# Patient Record
Sex: Female | Born: 1955 | Race: White | Hispanic: No | Smoking: Never smoker
Health system: Southern US, Community
[De-identification: ages and names within clinical notes are randomized; demographics above are authoritative.]

## PROBLEM LIST (undated history)

## (undated) DIAGNOSIS — N151 Renal and perinephric abscess: Secondary | ICD-10-CM

## (undated) DIAGNOSIS — C44711 Basal cell carcinoma of skin of unspecified lower limb, including hip: Secondary | ICD-10-CM

## (undated) DIAGNOSIS — N200 Calculus of kidney: Secondary | ICD-10-CM

## (undated) DIAGNOSIS — I10 Essential (primary) hypertension: Secondary | ICD-10-CM

## (undated) DIAGNOSIS — F329 Major depressive disorder, single episode, unspecified: Secondary | ICD-10-CM

## (undated) DIAGNOSIS — F32A Depression, unspecified: Secondary | ICD-10-CM

## (undated) DIAGNOSIS — E785 Hyperlipidemia, unspecified: Secondary | ICD-10-CM

## (undated) DIAGNOSIS — R011 Cardiac murmur, unspecified: Secondary | ICD-10-CM

## (undated) DIAGNOSIS — F419 Anxiety disorder, unspecified: Secondary | ICD-10-CM

## (undated) DIAGNOSIS — D649 Anemia, unspecified: Secondary | ICD-10-CM

## (undated) DIAGNOSIS — N641 Fat necrosis of breast: Secondary | ICD-10-CM

## (undated) HISTORY — DX: Calculus of kidney: N20.0

## (undated) HISTORY — DX: Hyperlipidemia, unspecified: E78.5

## (undated) HISTORY — PX: BREAST SURGERY: SHX581

## (undated) HISTORY — DX: Essential (primary) hypertension: I10

## (undated) HISTORY — DX: Anxiety disorder, unspecified: F41.9

## (undated) HISTORY — DX: Fat necrosis of breast: N64.1

## (undated) HISTORY — DX: Basal cell carcinoma of skin of unspecified lower limb, including hip: C44.711

## (undated) HISTORY — PX: ABDOMINAL HYSTERECTOMY: SHX81

## (undated) HISTORY — DX: Anemia, unspecified: D64.9

## (undated) HISTORY — DX: Cardiac murmur, unspecified: R01.1

## (undated) HISTORY — DX: Major depressive disorder, single episode, unspecified: F32.9

## (undated) HISTORY — PX: TUBAL LIGATION: SHX77

## (undated) HISTORY — DX: Depression, unspecified: F32.A

## (undated) HISTORY — PX: EYE SURGERY: SHX253

## (undated) HISTORY — PX: ARTHROPLASTY W/ ARTHROSCOPY MEDIAL / LATERAL COMPARTMENT KNEE: SUR78

## (undated) HISTORY — DX: Renal and perinephric abscess: N15.1

---

## 1988-08-13 HISTORY — PX: BREAST ENHANCEMENT SURGERY: SHX7

## 1988-08-13 HISTORY — PX: AUGMENTATION MAMMAPLASTY: SUR837

## 2001-01-24 ENCOUNTER — Emergency Department (HOSPITAL_COMMUNITY): Admission: EM | Admit: 2001-01-24 | Discharge: 2001-01-24 | Payer: Self-pay

## 2001-03-12 ENCOUNTER — Encounter: Admission: RE | Admit: 2001-03-12 | Discharge: 2001-03-12 | Payer: Self-pay | Admitting: Unknown Physician Specialty

## 2001-03-12 ENCOUNTER — Encounter: Payer: Self-pay | Admitting: Unknown Physician Specialty

## 2006-07-25 ENCOUNTER — Ambulatory Visit (HOSPITAL_COMMUNITY): Admission: RE | Admit: 2006-07-25 | Discharge: 2006-07-25 | Payer: Self-pay | Admitting: Urology

## 2008-08-13 HISTORY — PX: RENAL BIOPSY: SHX156

## 2009-08-04 ENCOUNTER — Encounter: Admission: RE | Admit: 2009-08-04 | Discharge: 2009-08-04 | Payer: Self-pay | Admitting: Emergency Medicine

## 2010-07-31 DIAGNOSIS — N059 Unspecified nephritic syndrome with unspecified morphologic changes: Secondary | ICD-10-CM | POA: Insufficient documentation

## 2010-09-04 ENCOUNTER — Ambulatory Visit: Admission: RE | Admit: 2010-09-04 | Discharge: 2010-09-04 | Payer: Self-pay | Source: Home / Self Care

## 2010-09-04 DIAGNOSIS — E669 Obesity, unspecified: Secondary | ICD-10-CM | POA: Insufficient documentation

## 2010-09-04 DIAGNOSIS — I10 Essential (primary) hypertension: Secondary | ICD-10-CM | POA: Insufficient documentation

## 2010-09-04 DIAGNOSIS — E785 Hyperlipidemia, unspecified: Secondary | ICD-10-CM | POA: Insufficient documentation

## 2010-09-14 NOTE — Assessment & Plan Note (Signed)
Summary: NP/REFERRED BY DR Delphina Banks   Vital Signs:  Patient profile:   55 year old female Height:      64 inches Weight:      201.8 pounds BMI:     34.76  Vitals Entered By: Iver Nestle PHD (September 04, 2010 11:23 AM)  History of Present Illness: Assessment:  Spent 60 min w/ pt.  April Banks was referred by April Banks, for HTN, hyperlipidemia, and IgA nephropathy, diagnosed Dec 19.  She has tried to follow a mostly vegetarian, low-Na diet since her dx of IgA nephropathy, but with travelling a couple weeks out of the month, often to rural areas, food choices are usually limited.  Usual eating pattern includes 3 meals & occasional snacks.  Everyday foods/beverages include water only.  No current usual exercise routine, but she has started back; [facility], and goes 3 X wk w/ trainer (30 min wts, 30 min cardio) and 30 min walking on her own 2-3 X wk during wks when she is home.  April Banks has struggled w/ wt most of her adult life, and has gained 30 lb in the past 2 yr.  24-hr recall suggests intake of [phone] kcal: B (AM)- none; L (1 PM)- 1 c minestrone soup, (1/3 zuc) zucchini fritters, water; D (PM)- (1/3 zuc) zucchini fritters, 1 c store-bought broc & cranberry slaw, 1 slc wholewht toast w/ 1 tsp butter, water.  Atypical intake in that she got up late, so no bkfst.  Med's include 10 mEq  K-citrate, Diovan 80 mg, Cardizem, Furosemide, ASA, 10-3998 Fish oil, Acyclovir 200 mg, Clonazepam as needed.  April Banks is trying to stay below 40 gm protein per day, so is currently eating meat only  ~1 X wk.  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    Nutrition Diagnosis:  Physical inactivity (NB-2.1) related to travel & time constraints as evidenced by report of no exercise during travel 2 wks a month.  Excessive mineral intake (sodium) (NI-55.2) related to restaurant meals  as evidenced by frequent restaurant meals during 2 wks of the month.    Intervention: See Patient Instructions.    Monitoring/Eval:  Dietary intake, body weight, and exercise at F/U.     Other Orders: Inital Assessment Each 26min - [place] [phone])  Patient Instructions: 1)  Wt loss:  Let's think in terms of 10-lb increments. 2)  Most meat, fish, chicken has  ~7 grams of protein per oz.  One egg is also  ~7 grams of protein, and each cup of milk or yogurt is  ~8 grams.   3)  Restaurants:  Ask re. source of fats; ask for veg's to be served plain; NO fried foods; no or few meats; be cautious with condiments (trans fat & sodium).   4)  Limit sodium to 1500 mg per day.  For now, track your Na intake daily by recording your intake for the next couple of weeks.   5)  Tuna:  Low/very low-Na; canned beans:  Go to [employer].   6)  Travel: Bring snacks, including fruit (bananas, apple sauce, fruit cups, grapes), 1-2-tbsp portions of unsalted nuts/seeds.  If you use bars, look for those with a lot of nuts/seeds/dried fruit for low-protein and low-Na.  7)  Consider your meat portion size to be 2 oz per meal, and aim for this  only 3 X wk.  Use beans as occasional protein source.   8)  Email exact amt of DHA & EPA in your fish oil supplement.   9)  Continue to get veg's with both lunch & dinner (for wt mgmt & for BP), but double the amount you are getting now.   10)  Please make a follow-up appt for 3-4 wks.     Orders Added: 1)  Inital Assessment Each 61min - [place]

## 2010-09-28 ENCOUNTER — Ambulatory Visit (INDEPENDENT_AMBULATORY_CARE_PROVIDER_SITE_OTHER): Payer: BC Managed Care – PPO | Admitting: Family Medicine

## 2010-09-28 DIAGNOSIS — N059 Unspecified nephritic syndrome with unspecified morphologic changes: Secondary | ICD-10-CM

## 2010-09-28 DIAGNOSIS — I10 Essential (primary) hypertension: Secondary | ICD-10-CM

## 2010-09-28 NOTE — Patient Instructions (Signed)
-   Breakfast:  Record what, how much, and what time you eat.  Then record first time notice hunger.  You may notice a difference with certain cereals, i.e., higher fiber?  (Add berries?) - Dates:  Try stuffing with walnuts or almonds.   - Consider Viactiv calcium chews as a "sweet treat" supplement.   - Meal planning:  Aim for 3-component meals for optimal satisfaction.   - Walking:  When traveling, build in your walking as possible; ask if anyone is interested in walking with you.  Make a conscious effort to move more as you go through your day (exercise opportunitues!).   - Continue to limit Na to 1500 mg/day.  - Limit fat to 45 grams per day.   - Carb's:  Choose those with the most fiber (and continue to drink lots of water).

## 2010-09-28 NOTE — Progress Notes (Signed)
Medical Nutrition Therapy:  Appt start time: 1000 end time:  1100.  Assessment:  Primary concerns today: Weight management and sodium, protein, and fat intake. Sherida has been working hard to keep her sodium and protein intake down, due to IgA nephropathy, aiming for no more than 55 g protein and 1500 mg Na daily.  Cyndy travels for work most of the time, but is currently at home for 2 wks, so has been able to get to the gym, and to work with her Physiological scientist a couple X wk.  Travel frequently will be to remote areas, i.e., [place], where food variety will be limited.  Food records since Kajal has been home indicate that she's been averaging no more than 50 g protein, ~60 g fat, and ~1500 mg Na daily.    Progress Towards Goal(s):  In progress.   Nutritional Diagnosis:  NB-2.1 Physical inactivity As related to travel & time constraints.  As evidenced by exercise of only 3-4 X wk. NI-5.10.2 Excessive mineral intake (specify): sodium As related to restaurant & processed foods.  As evidenced by reported travel in which all meals are eaten out.    Monitoring/Evaluation:  Dietary intake, weight, & exercise in 1 month.

## 2010-11-09 ENCOUNTER — Ambulatory Visit: Payer: Self-pay | Admitting: Family Medicine

## 2010-12-29 NOTE — Op Note (Signed)
April Banks, April Banks                ACCOUNT NO.:  [id number]   MEDICAL RECORD NO.:  [id number]          PATIENT TYPE:  AMB   LOCATION:  DAY                          FACILITY[facility]  [facility]   PHYSICIAN:  Sigmund I. Tannenbaum, M.D.DATE OF BIRTH:  June 21, 1956   DATE OF PROCEDURE:  07/25/2006  DATE OF DISCHARGE:                               OPERATIVE REPORT   PREOPERATIVE DIAGNOSIS:  A 2.4 cm left upper pole renal calculus.   POSTOPERATIVE DIAGNOSIS:  A 2.4 cm left upper pole renal calculus.   OPERATION:  Cystourethroscopy, left retrograde pyelogram, left double-J  catheter.   SURGEON:  Sigmund I. Gaynelle Arabian, M.D.   ANESTHESIA:  General LMA.   PREPARATION:  Appropriate pre-anesthesia, the patient was brought to the  operating room and placed on the operating room in the dorsal supine  position where general LMA anesthesia was induced.  She was then  replaced in dorsal lithotomy position where the pubis was prepped with  Betadine solution and draped in the usual fashion.   PROCEDURE:  Cystoscopy was accomplished.  It shows a normal-appearing  bladder with granular trigonitis, petechiae around left ureteral  orifice.  Left retrograde pyelograms performed which showed normal-  appearing ureter, and renal pelvis which appeared normal.  There was a  left upper pole calculus.  I could not demonstrate infundibular stenosis  of the upper pole ureter.  A double-J catheter was then passed over a  guide wire, measuring 6-French x 24 cm, and coiled in the renal pelvis,  and then the bladder.  The patient tolerated the procedure well.  She  was awakened and taken to the recovery room in good condition.      Sigmund I. Gaynelle Arabian, M.D.  Electronically Signed     SIT/MEDQ  D:  07/25/2006  T:  07/25/2006  Job:  [id number]

## 2011-04-03 ENCOUNTER — Other Ambulatory Visit: Payer: Self-pay | Admitting: Family Medicine

## 2011-04-03 DIAGNOSIS — Z1231 Encounter for screening mammogram for malignant neoplasm of breast: Secondary | ICD-10-CM

## 2011-04-20 ENCOUNTER — Ambulatory Visit: Payer: BC Managed Care – PPO

## 2011-05-23 ENCOUNTER — Ambulatory Visit
Admission: RE | Admit: 2011-05-23 | Discharge: 2011-05-23 | Disposition: A | Discharge status: Home / Self Care | Payer: BC Managed Care – PPO | Source: Ambulatory Visit | Attending: Family Medicine | Admitting: Family Medicine

## 2011-05-23 ENCOUNTER — Other Ambulatory Visit: Payer: Self-pay | Admitting: Family Medicine

## 2011-05-23 DIAGNOSIS — Z1231 Encounter for screening mammogram for malignant neoplasm of breast: Secondary | ICD-10-CM

## 2011-08-25 ENCOUNTER — Encounter: Payer: Self-pay | Admitting: Family Medicine

## 2011-08-25 DIAGNOSIS — I341 Nonrheumatic mitral (valve) prolapse: Secondary | ICD-10-CM | POA: Insufficient documentation

## 2011-08-25 DIAGNOSIS — N200 Calculus of kidney: Secondary | ICD-10-CM | POA: Insufficient documentation

## 2011-08-25 DIAGNOSIS — A6009 Herpesviral infection of other urogenital tract: Secondary | ICD-10-CM

## 2011-08-25 DIAGNOSIS — I10 Essential (primary) hypertension: Secondary | ICD-10-CM

## 2011-09-14 ENCOUNTER — Ambulatory Visit (INDEPENDENT_AMBULATORY_CARE_PROVIDER_SITE_OTHER): Payer: BC Managed Care – PPO | Admitting: Family Medicine

## 2011-09-14 ENCOUNTER — Encounter: Payer: Self-pay | Admitting: Family Medicine

## 2011-09-14 VITALS — BP 139/81 | HR 64 | Temp 98.0°F | Resp 16 | Ht 63.25 in | Wt 209.0 lb

## 2011-09-14 DIAGNOSIS — E782 Mixed hyperlipidemia: Secondary | ICD-10-CM

## 2011-09-14 DIAGNOSIS — N189 Chronic kidney disease, unspecified: Secondary | ICD-10-CM

## 2011-09-14 DIAGNOSIS — I10 Essential (primary) hypertension: Secondary | ICD-10-CM

## 2011-09-14 DIAGNOSIS — E785 Hyperlipidemia, unspecified: Secondary | ICD-10-CM

## 2011-09-14 DIAGNOSIS — N059 Unspecified nephritic syndrome with unspecified morphologic changes: Secondary | ICD-10-CM

## 2011-09-14 MED ORDER — VALSARTAN 80 MG PO TABS: 80.0000 mg | ORAL_TABLET | Freq: Every day | ORAL | Status: DC

## 2011-09-14 MED ORDER — FUROSEMIDE 20 MG PO TABS: 60.0000 mg | ORAL_TABLET | Freq: Every day | ORAL | Status: DC

## 2011-09-14 MED ORDER — POTASSIUM CITRATE ER 10 MEQ (1080 MG) PO TBCR: 10.0000 meq | EXTENDED_RELEASE_TABLET | Freq: Every morning | ORAL | Status: DC

## 2011-09-14 MED ORDER — DILTIAZEM HCL 120 MG PO TABS: 120.0000 mg | ORAL_TABLET | Freq: Every day | ORAL | Status: DC

## 2011-09-14 NOTE — Assessment & Plan Note (Signed)
Stable. Cont lasix and potassium citrate at current doses.  CMP pending.  Pt to f/u

## 2011-09-14 NOTE — Assessment & Plan Note (Signed)
Overall controlled.  Will check creatinine on fasting labs tomorrow or Monday.

## 2011-09-14 NOTE — Patient Instructions (Signed)
Once bloodwork obtained, we will contact you with results within 7-10 days.  Continue same meds for now, and recheck with nephrology as able to schedule. Follow up with me in 6 months, sooner if any new symptoms.

## 2011-09-14 NOTE — Assessment & Plan Note (Signed)
Lipids pending - continue crestor for now at current dose.

## 2011-09-14 NOTE — Progress Notes (Signed)
  Subjective:    Patient ID: April Banks, female    DOB: [birth year], 56 y.o.   MRN: [id number]  HPI Follow up   HTN, with Hx Chronic Kidney dz - last nephrology ov 7/12 d/t scheduling conflicts.  Wants to have Creatinine checked.  No cp/lightheadedness/dizziness/n/v.  Home bp's:111-140/70-80.  Hyperlipidemia - last lipids 7/12:  Tc195, LDL107, HDL 69.  No arthralgis/myalgias, or new fatigue.  Review of Systems  Constitutional: Positive for unexpected weight change.       Wt 202-204 at home.  Trouble losing weight. Seen nutritionist, following diet.  Exercise 3-5x/week, 42mins. No sodas.     HENT: Negative for facial swelling and neck stiffness.   Respiratory: Negative for cough, chest tightness, shortness of breath and wheezing.   Cardiovascular: Positive for leg swelling. Negative for chest pain and palpitations.       Occasional ankle edema with travel.    Gastrointestinal: Negative for diarrhea, constipation and blood in stool.  Genitourinary: Negative for frequency, hematuria, decreased urine volume and difficulty urinating.  Musculoskeletal: Negative for myalgias and arthralgias.  Skin: Negative for rash.  Neurological: Negative for dizziness, weakness and light-headedness.  Hematological: Does not bruise/bleed easily.  Psychiatric/Behavioral: Negative for dysphoric mood. The patient is not nervous/anxious.        Objective:   Physical Exam  Constitutional: She is oriented to person, place, and time. She appears well-developed and well-nourished.  HENT:  Head: Normocephalic and atraumatic.  Eyes: EOM are normal. Pupils are equal, round, and reactive to light.  Neck: Normal range of motion. Neck supple. No JVD present. No tracheal deviation present. No thyromegaly present.  Cardiovascular: Normal rate, regular rhythm, normal heart sounds and intact distal pulses.   No murmur heard. Pulmonary/Chest: Effort normal and breath sounds normal.  Abdominal: Bowel sounds are  normal. She exhibits no distension and no abdominal bruit. There is no tenderness.  Musculoskeletal: She exhibits edema.       Trace pedal edema  Neurological: She is alert and oriented to person, place, and time.  Skin: Skin is warm and dry. No rash noted. No erythema.  Psychiatric: She has a normal mood and affect. Her behavior is normal. Judgment and thought content normal.          Assessment & Plan:   1. Essential hypertension, benign  valsartan (DIOVAN) 80 MG tablet, diltiazem (CARDIZEM) 120 MG tablet, Comprehensive metabolic panel, Lipid panel  2. HTN (hypertension)  furosemide (LASIX) 20 MG tablet  3. Nephritis and nephropathy, not specified as acute or chronic, with unspecified pathological lesion in kidney  potassium citrate (UROCIT-K) 10 MEQ (1080 MG) SR tablet  4. Hyperlipidemia     Will check CMP/lipids fasting next few days.  Overall stable.  No changes in meds today. Continue exercise and watching diet (including portions and carbs) for diet control.   Pt will f/u here 6 mo, and nephrologist when can schedule.

## 2011-09-17 ENCOUNTER — Other Ambulatory Visit: Payer: Self-pay | Admitting: Family Medicine

## 2011-09-17 ENCOUNTER — Other Ambulatory Visit (INDEPENDENT_AMBULATORY_CARE_PROVIDER_SITE_OTHER): Payer: BC Managed Care – PPO

## 2011-09-17 VITALS — BP 127/80 | HR 55 | Temp 98.3°F | Resp 16

## 2011-09-17 DIAGNOSIS — I10 Essential (primary) hypertension: Secondary | ICD-10-CM

## 2011-09-17 DIAGNOSIS — N059 Unspecified nephritic syndrome with unspecified morphologic changes: Secondary | ICD-10-CM

## 2011-09-17 DIAGNOSIS — E785 Hyperlipidemia, unspecified: Secondary | ICD-10-CM

## 2011-09-18 LAB — COMPREHENSIVE METABOLIC PANEL
AST: 15 U/L (ref 0–37)
Albumin: 4.1 g/dL (ref 3.5–5.2)
Alkaline Phosphatase: 95 U/L (ref 39–117)
Potassium: 4.8 mEq/L (ref 3.5–5.3)
Sodium: 145 mEq/L (ref 135–145)
Total Protein: 5.9 g/dL — ABNORMAL LOW (ref 6.0–8.3)

## 2011-09-18 LAB — LIPID PANEL
HDL: 70 mg/dL (ref >39)
LDL Cholesterol: 143 mg/dL — ABNORMAL HIGH (ref 0–99)

## 2011-10-09 ENCOUNTER — Telehealth: Payer: Self-pay

## 2011-10-09 NOTE — Telephone Encounter (Signed)
Labs faxed to Dr. Jerel Shepherd office via epic.

## 2011-10-09 NOTE — Telephone Encounter (Signed)
Dr Johnn Hai office calling to get patients last bloodwork sent over as soon as possible the faxed number is [phone] attn dr Joseph Berkshire

## 2011-12-26 ENCOUNTER — Ambulatory Visit (INDEPENDENT_AMBULATORY_CARE_PROVIDER_SITE_OTHER): Payer: BC Managed Care – PPO | Admitting: Family Medicine

## 2011-12-26 VITALS — BP 136/83 | HR 90 | Temp 99.0°F | Resp 18 | Ht 64.0 in | Wt 210.0 lb

## 2011-12-26 DIAGNOSIS — J40 Bronchitis, not specified as acute or chronic: Secondary | ICD-10-CM

## 2011-12-26 DIAGNOSIS — N63 Unspecified lump in unspecified breast: Secondary | ICD-10-CM

## 2011-12-26 DIAGNOSIS — D4860 Neoplasm of uncertain behavior of unspecified breast: Secondary | ICD-10-CM

## 2011-12-26 MED ORDER — HYDROCODONE-HOMATROPINE 5-1.5 MG/5ML PO SYRP: 5.0000 mL | ORAL_SOLUTION | ORAL | Status: AC | PRN

## 2011-12-26 MED ORDER — AZITHROMYCIN 250 MG PO TABS: ORAL_TABLET | ORAL | Status: AC

## 2011-12-26 MED ORDER — BENZONATATE 100 MG PO CAPS: ORAL_CAPSULE | ORAL | Status: AC

## 2011-12-26 NOTE — Progress Notes (Signed)
Subjective: Patient is here for 2 reasons. About 2 weeks ago she noticed a lump in her right breast. She has breast implants as above that. It is near the scar that she has from a skin lesion that was from he did have a mammogram last August which was reportedly normal. I reviewed that report nothing was seen. It is painful.  Her second problem is that she 10 days ago developed a sore throat, then a head cold and then involved her chest. She continues to cough up a lot of purulent phlegm. She doesn't feel good. She was at a convention and then on a bear hunt in [place]. She did tell 7 foot black bear. She just got back to town this is why she hasn't been treated yet.  Objective: Pleasant lady in no acute distress this time. TMs normal. Throat clear. Neck supple without significant nodes. Rest clear to auscultation heart regular without murmurs  Breast exam breasts are large full no masses in the left breast a the implant is very soft and not extrema apparent as it is deep in the tissue. Right breast is same, but has a scar superiorly. Above the implant there is a a fibronodular area and it is non-not firm. It does does have some tenderness.    Assessment: Breast lump Breast tenderness URI and bronchitis  Plan: Mammogram Cough medications and in fact.

## 2011-12-26 NOTE — Patient Instructions (Signed)

## 2011-12-28 ENCOUNTER — Ambulatory Visit: Payer: BC Managed Care – PPO | Admitting: Family Medicine

## 2012-01-02 ENCOUNTER — Ambulatory Visit
Admission: RE | Admit: 2012-01-02 | Discharge: 2012-01-02 | Disposition: A | Discharge status: Home / Self Care | Payer: BC Managed Care – PPO | Source: Ambulatory Visit | Attending: Family Medicine | Admitting: Family Medicine

## 2012-01-02 ENCOUNTER — Other Ambulatory Visit: Payer: Self-pay | Admitting: Family Medicine

## 2012-01-02 DIAGNOSIS — N63 Unspecified lump in unspecified breast: Secondary | ICD-10-CM

## 2012-01-03 ENCOUNTER — Telehealth: Payer: Self-pay

## 2012-01-03 DIAGNOSIS — N649 Disorder of breast, unspecified: Secondary | ICD-10-CM

## 2012-01-03 NOTE — Telephone Encounter (Signed)
Pt was advised by radiologist recommended pt to dermatologist, due to the area where skin cancer was removed. Dermatologist says they are not the ones to deal with this, patient is perplexed as to who to seek treatment through.  Please call

## 2012-01-04 NOTE — Telephone Encounter (Signed)
Dr hopper, who would you like for patient to see?

## 2012-01-07 NOTE — Telephone Encounter (Signed)
LMOM for pt that referral has been made to [facility].

## 2012-01-07 NOTE — Telephone Encounter (Signed)
I though I had already replied to this.  Refer to [facility] [place] [facility] for evaluation of lesion on  Breast.

## 2012-01-10 ENCOUNTER — Telehealth: Payer: Self-pay | Admitting: Radiology

## 2012-01-10 NOTE — Telephone Encounter (Signed)
Message copied by Walden Field A on Thu Jan 10, 2012  1:45 PM ------      Message from: HOPPER, DAVID H      Created: Sun Jan 06, 2012 10:18 PM       Please call the patient back and make a referral to [facility] for their assessment of this.  Tell her if problems arise in making these arrangements she should come back in here for recheck.

## 2012-01-10 NOTE — Telephone Encounter (Signed)
Gave pt message--she will wait for a call from [place].

## 2012-01-16 ENCOUNTER — Ambulatory Visit (INDEPENDENT_AMBULATORY_CARE_PROVIDER_SITE_OTHER): Payer: BC Managed Care – PPO | Admitting: Surgery

## 2012-01-23 ENCOUNTER — Encounter (INDEPENDENT_AMBULATORY_CARE_PROVIDER_SITE_OTHER): Payer: Self-pay | Admitting: Surgery

## 2012-02-15 ENCOUNTER — Ambulatory Visit (INDEPENDENT_AMBULATORY_CARE_PROVIDER_SITE_OTHER): Payer: BC Managed Care – PPO | Admitting: Surgery

## 2012-02-15 ENCOUNTER — Encounter (INDEPENDENT_AMBULATORY_CARE_PROVIDER_SITE_OTHER): Payer: Self-pay | Admitting: Surgery

## 2012-02-15 VITALS — BP 142/84 | HR 72 | Temp 98.2°F | Resp 16 | Ht 64.0 in | Wt 208.0 lb

## 2012-02-15 DIAGNOSIS — N641 Fat necrosis of breast: Secondary | ICD-10-CM

## 2012-02-15 HISTORY — DX: Fat necrosis of breast: N64.1

## 2012-02-15 NOTE — Patient Instructions (Signed)
Return if area enlarges or returns.

## 2012-02-15 NOTE — Progress Notes (Signed)
Patient ID: April Banks, female   DOB: 07/12/1956, 56 y.o.   MRN: [id number]  No chief complaint on file.   HPI April Banks is a 56 y.o. female.   HPIPatient sent at the request of Dr. Nyoka Cowden due to Mass in right breast. She has had this for at least 6 months. It was located just below the right clavicle where she had a basal cell carcinoma excised one year ago. There is a small scar with a mass noted  along the scar. Mammogram and ultrasound showed area of fat necrosis in the scar. She is sent at the request of Dr. Nyoka Cowden in consultation. Since her workup in May, the areas got smaller and has almost disappeared. She has no pain or breast discharge.  Past Medical History  Diagnosis Date  . Renal abscess   . Basal cell carcinoma of leg   . Nephrolithiasis   . Anemia   . Heart murmur   . Hyperlipidemia   . Hypertension     Past Surgical History  Procedure Date  . Abdominal hysterectomy   . Arthroplasty w/ arthroscopy medial / lateral compartment knee   . Renal biopsy 2010  . Breast enhancement surgery 1990    Family History  Problem Relation Age of Onset  . Heart disease Father   . Skin cancer Father   . Colon cancer Maternal Grandmother   . Breast cancer Maternal Aunt     Social History History  Substance Use Topics  . Smoking status: Never Smoker   . Smokeless tobacco: Not on file  . Alcohol Use: 0.0 oz/week    4-5 Glasses of wine per week    Allergies  Allergen Reactions  . Gantrisin (Sulfisoxazole) Shortness Of Breath and Nausea Only  . Sulfa Antibiotics Shortness Of Breath and Rash  . Ceclor (Cefaclor) Rash  . Macrodantin Nausea And Vomiting  . Pyridium (Phenazopyridine Hcl)   . Pyridium (Phenazopyridine Hcl) Nausea Only    Current Outpatient Prescriptions  Medication Sig Dispense Refill  . acyclovir (ZOVIRAX) 200 MG capsule Take by mouth daily.        Mar[place] Kitchen aspirin 81 MG tablet Take 81 mg by mouth daily.        Marland Kitchen diltiazem (CARDIZEM) 120 MG tablet Take 1  tablet (120 mg total) by mouth daily.  90 tablet  1  . fish oil-omega-3 fatty acids 1000 MG capsule Take 2 g by mouth 3 (three) times daily.        . furosemide (LASIX) 20 MG tablet Take 3 tablets (60 mg total) by mouth daily.  270 tablet  1  . potassium citrate (UROCIT-K) 10 MEQ (1080 MG) SR tablet Take 1 tablet (10 mEq total) by mouth AC breakfast.  90 tablet  1  . rosuvastatin (CRESTOR) 20 MG tablet Take 10 mg by mouth daily.       . valsartan (DIOVAN) 80 MG tablet Take 1 tablet (80 mg total) by mouth daily.  90 tablet  1    Review of Systems Review of Systems  Constitutional: Negative for fever, chills and unexpected weight change.  HENT: Negative for hearing loss, congestion, sore throat, trouble swallowing and voice change.   Eyes: Negative for visual disturbance.  Respiratory: Negative for cough and wheezing.   Cardiovascular: Negative for chest pain, palpitations and leg swelling.  Gastrointestinal: Negative for nausea, vomiting, abdominal pain, diarrhea, constipation, blood in stool, abdominal distention and anal bleeding.  Genitourinary: Negative for hematuria, vaginal bleeding and difficulty urinating.  Musculoskeletal: Negative for arthralgias.  Skin: Negative for rash and wound.  Neurological: Negative for seizures, syncope and headaches.  Hematological: Negative for adenopathy. Does not bruise/bleed easily.  Psychiatric/Behavioral: Negative for confusion.    Blood pressure 142/84, pulse 72, temperature 98.2 F (36.8 C), resp. rate 16, height $RemoveBe'5\' 4"'cAMKfRAFc$  (1.626 m), weight 208 lb (94.348 kg).  Physical Exam Physical Exam  Constitutional: She is oriented to person, place, and time. She appears well-developed and well-nourished.  HENT:  Head: Normocephalic and atraumatic.  Eyes: EOM are normal. Pupils are equal, round, and reactive to light.  Neck: Normal range of motion. Neck supple.  Cardiovascular: Normal rate and regular rhythm.   Pulmonary/Chest: Effort normal and breath  sounds normal.  Abdominal: Soft. Bowel sounds are normal.  Musculoskeletal: Normal range of motion.  Neurological: She is alert and oriented to person, place, and time.  Skin:       Data Reviewed   MM Digital Diagnostic Unilat R   Status: Final result        Study Result     *RADIOLOGY REPORT*   Clinical Data:  The patient has a palpable abnormality in the anterior right chest wall, adjacent to a scar for removal of a basal cell cancer, in June 2012.  Area of concern is approximately 20 cm above the nipple, 12 o'clock location.   DIGITAL DIAGNOSTIC RIGHT MAMMOGRAM WITH CAD AND RIGHT BREAST ULTRASOUND:   Comparison:  01/02/2012   Findings:  The patient has a right subpectoral implant.  The area of concern is marked with BB.  Parenchyma in this region is unremarkable in appearance, primarily fatty replaced in this region.  Spot tangential views shows only fibrofatty tissue and no mass. Mammographic images were processed with CAD.   On physical exam, I palpate soft thickening just superior to the linear scar, on the chest wall, near the clavicle.  The thickening is best palpated and the patient sits up.   Ultrasound is performed, showing superficial hyper echoic breast parenchyma/subcutaneous tissue, associated with small cystic changes in the area of palpable concern adjacent to the skin scar. Findings are consistent with fat necrosis.  No suspicious abnormality identified.   IMPRESSION:   1.  No mammographic or sonographic evidence for malignancy. 2.  The area of palpable concern has sonographic features consistent with fat necrosis. 3.  Because the changes are adjacent to excisional site for basal cell carcinoma, I would recommend a consultation with the patient's dermatologist, Dr. Janan Ridge in [place]. 4.  Screening mammogram is suggested in October 2013.   BI-RADS CATEGORY 2:  Benign finding(s).    Assessment    Fat necrosis right breast      Plan    Return as needed or if area enlarges.       Emmauel Hallums A. 02/15/2012, 9:55 AM

## 2012-03-28 ENCOUNTER — Ambulatory Visit: Payer: BC Managed Care – PPO | Admitting: Family Medicine

## 2012-04-25 ENCOUNTER — Encounter: Payer: Self-pay | Admitting: Family Medicine

## 2012-04-25 ENCOUNTER — Other Ambulatory Visit: Payer: Self-pay | Admitting: Family Medicine

## 2012-04-25 ENCOUNTER — Ambulatory Visit (INDEPENDENT_AMBULATORY_CARE_PROVIDER_SITE_OTHER): Payer: BC Managed Care – PPO | Admitting: Family Medicine

## 2012-04-25 VITALS — BP 134/77 | HR 87 | Temp 98.0°F | Resp 16 | Ht 63.5 in | Wt 209.6 lb

## 2012-04-25 DIAGNOSIS — E782 Mixed hyperlipidemia: Secondary | ICD-10-CM

## 2012-04-25 DIAGNOSIS — E785 Hyperlipidemia, unspecified: Secondary | ICD-10-CM

## 2012-04-25 DIAGNOSIS — J069 Acute upper respiratory infection, unspecified: Secondary | ICD-10-CM

## 2012-04-25 DIAGNOSIS — I10 Essential (primary) hypertension: Secondary | ICD-10-CM

## 2012-04-25 DIAGNOSIS — N390 Urinary tract infection, site not specified: Secondary | ICD-10-CM

## 2012-04-25 NOTE — Progress Notes (Signed)
  Subjective:    Patient ID: April Banks, female    DOB: October 30, 1955, 56 y.o.   MRN: [id number]  HPI April Banks is a 56 y.o. female HTN - hx chronic kidney disease - nephrotic syndrome, IGA nephropathy.  Recent creat 1.45 at urologist - stable kidney function.  Increased cardizem to $RemoveBef'180mg'SrOHFDRngp$ .   Hyperlipidemia - lipids 03/24/12 - LDL 76.  On crestor 10 mg QD (had cramping with $RemoveBefor'20mg'IiDjkthtsKHp$ , had been on $Remov'10mg'qKJDLn$  for 3 weeks prior to labs at nephrologist.  Still working on weight.  Saw surgeon for area on upper r breast - fatty tissue/scar.  Mammogram each year.  No other concerns at present.  Cold symptoms since last night.  Nasal congestion. No fever.   Review of Systems  Constitutional: Negative for fever.  Respiratory: Negative for chest tightness and shortness of breath.   Cardiovascular: Negative for chest pain, palpitations and leg swelling.  Gastrointestinal: Negative for abdominal pain and blood in stool.  Neurological: Negative for dizziness, syncope and light-headedness.       Objective:   Physical Exam  Constitutional: She is oriented to person, place, and time. She appears well-developed and well-nourished.  HENT:  Head: Normocephalic and atraumatic.  Eyes: Conjunctivae normal and EOM are normal. Pupils are equal, round, and reactive to light.  Neck: Carotid bruit is not present.  Cardiovascular: Normal rate, regular rhythm, normal heart sounds and intact distal pulses.   Pulmonary/Chest: Effort normal and breath sounds normal. She has no wheezes. She has no rales.  Abdominal: Soft. She exhibits no pulsatile midline mass. There is no tenderness.  Neurological: She is alert and oriented to person, place, and time.  Skin: Skin is warm and dry.  Psychiatric: She has a normal mood and affect. Her behavior is normal.          Assessment & Plan:  April Banks is a 56 y.o. female 1. HTN (hypertension)   2. Hyperlipidemia   3. URI (upper respiratory infection)    HTN -  reviewed outside labs.  Creat stable. Continue same doses of meds as adjusted by nephrology.  Plan to follow up with nephrology f in 6 months, and if labs done there, to have copy sent to me.   Hyperlipidemia - stable.  Tolerating lower dose of Crestor - can recheck levels in 6 months.   URI - early. Can use saline ns, mucinex.  Afrin before flying only - short course.  Sx care and rtc precautions.    Has 6 month appt with nephrologist. Can schedule CPE with me in next 6-9 months.

## 2012-04-25 NOTE — Progress Notes (Signed)
  Subjective:    Patient ID: April Banks, female    DOB: [birth year], 56 y.o.   MRN: [id number]  HPI    Review of Systems     Objective:   Physical Exam        Assessment & Plan:

## 2012-05-14 ENCOUNTER — Other Ambulatory Visit: Payer: Self-pay | Admitting: Family Medicine

## 2012-05-14 DIAGNOSIS — Z1231 Encounter for screening mammogram for malignant neoplasm of breast: Secondary | ICD-10-CM

## 2012-06-11 ENCOUNTER — Encounter: Payer: Self-pay | Admitting: Family Medicine

## 2012-06-16 ENCOUNTER — Ambulatory Visit (INDEPENDENT_AMBULATORY_CARE_PROVIDER_SITE_OTHER): Payer: BC Managed Care – PPO | Admitting: *Deleted

## 2012-06-16 DIAGNOSIS — Z23 Encounter for immunization: Secondary | ICD-10-CM

## 2012-06-30 ENCOUNTER — Ambulatory Visit
Admission: RE | Admit: 2012-06-30 | Discharge: 2012-06-30 | Disposition: A | Discharge status: Home / Self Care | Payer: BC Managed Care – PPO | Source: Ambulatory Visit | Attending: Family Medicine | Admitting: Family Medicine

## 2012-06-30 DIAGNOSIS — Z1231 Encounter for screening mammogram for malignant neoplasm of breast: Secondary | ICD-10-CM

## 2012-09-16 ENCOUNTER — Other Ambulatory Visit: Payer: Self-pay | Admitting: Physician Assistant

## 2013-01-20 ENCOUNTER — Telehealth: Payer: Self-pay

## 2013-01-20 NOTE — Telephone Encounter (Signed)
This is not something that we typically do - if a particular provider has done this for her in the past she should see them before she leaves.

## 2013-01-20 NOTE — Telephone Encounter (Signed)
Pt called back and reported that she is leaving for [place] Friday and when she has gone in the past the [employer] has given her a Rx of Cipro to take along in case she gets an intestinal infection. She does not need any immunizations this year so is calling to see if Dr Carlota Raspberry would Rx a round of Cipro to use prn.

## 2013-01-20 NOTE — Telephone Encounter (Signed)
PT STATES SHE IS TRAVELING TO [place] AND WOULD LIKE TO HAVE AN ANTIBIOTIC CALLED IN FOR HER. PLEASE CALL [phone]    [facility] ON [address]

## 2013-01-20 NOTE — Telephone Encounter (Signed)
Please advise 

## 2013-01-20 NOTE — Telephone Encounter (Signed)
Called her to advise.  

## 2013-01-21 ENCOUNTER — Other Ambulatory Visit: Payer: Self-pay | Admitting: Family Medicine

## 2013-01-21 DIAGNOSIS — Z7184 Encounter for health counseling related to travel: Secondary | ICD-10-CM

## 2013-01-21 MED ORDER — CIPROFLOXACIN HCL 500 MG PO TABS: 500.0000 mg | ORAL_TABLET | Freq: Every day | ORAL | Status: DC

## 2013-01-21 NOTE — Telephone Encounter (Signed)
Pt understood and agreed to instr's. Thanked Dr Carlota Raspberry for the Rx.

## 2013-01-21 NOTE — Telephone Encounter (Addendum)
Called patient - left msg to call back for instructions.  I prescribed Cipro if needed - 500 mg qd for 3 days. Only to take if persistent unformed stools greater than 3-4 per day. Prior to that can take pepto bismol or immodium if needed.  Avoid immodium of blood in the stool or diarrhea. She can call back to discuss above, or can try to call her again. Let me know if there are any questions.

## 2013-01-21 NOTE — Telephone Encounter (Signed)
LMOM to CB and ask for me for instr's.

## 2013-04-01 ENCOUNTER — Telehealth: Payer: Self-pay | Admitting: *Deleted

## 2013-04-17 NOTE — Telephone Encounter (Signed)
Returned her call, left message for her to call me back.  She needs office visit, she is very overdue.

## 2013-04-17 NOTE — Telephone Encounter (Signed)
Patient requests her Rx for Clonazepam, she is advised she will need an office visit. She is transferred to schedule

## 2013-04-17 NOTE — Telephone Encounter (Signed)
Patient wants to discuss med refills last filled in 2012.  [phone]

## 2013-05-28 ENCOUNTER — Ambulatory Visit (INDEPENDENT_AMBULATORY_CARE_PROVIDER_SITE_OTHER): Payer: BC Managed Care – PPO | Admitting: *Deleted

## 2013-05-28 DIAGNOSIS — Z23 Encounter for immunization: Secondary | ICD-10-CM

## 2013-06-02 ENCOUNTER — Other Ambulatory Visit: Payer: Self-pay

## 2013-06-02 DIAGNOSIS — Z1231 Encounter for screening mammogram for malignant neoplasm of breast: Secondary | ICD-10-CM

## 2013-06-15 ENCOUNTER — Ambulatory Visit: Payer: BC Managed Care – PPO | Admitting: Family Medicine

## 2013-07-13 ENCOUNTER — Encounter: Payer: Self-pay | Admitting: Family Medicine

## 2013-07-13 ENCOUNTER — Ambulatory Visit (INDEPENDENT_AMBULATORY_CARE_PROVIDER_SITE_OTHER): Payer: BC Managed Care – PPO | Admitting: Family Medicine

## 2013-07-13 VITALS — BP 110/70 | HR 60 | Temp 99.7°F | Resp 16 | Ht 64.0 in | Wt 208.6 lb

## 2013-07-13 DIAGNOSIS — B009 Herpesviral infection, unspecified: Secondary | ICD-10-CM

## 2013-07-13 DIAGNOSIS — I10 Essential (primary) hypertension: Secondary | ICD-10-CM

## 2013-07-13 DIAGNOSIS — N028 Recurrent and persistent hematuria with other morphologic changes: Secondary | ICD-10-CM

## 2013-07-13 DIAGNOSIS — F418 Other specified anxiety disorders: Secondary | ICD-10-CM

## 2013-07-13 DIAGNOSIS — N059 Unspecified nephritic syndrome with unspecified morphologic changes: Secondary | ICD-10-CM

## 2013-07-13 DIAGNOSIS — F411 Generalized anxiety disorder: Secondary | ICD-10-CM

## 2013-07-13 MED ORDER — CLONAZEPAM 0.5 MG PO TABS: 0.5000 mg | ORAL_TABLET | Freq: Two times a day (BID) | ORAL | Status: DC | PRN

## 2013-07-13 MED ORDER — ACYCLOVIR 200 MG PO CAPS: ORAL_CAPSULE | ORAL | Status: DC

## 2013-07-13 MED ORDER — POTASSIUM CITRATE ER 10 MEQ (1080 MG) PO TBCR: EXTENDED_RELEASE_TABLET | ORAL | Status: DC

## 2013-07-13 NOTE — Progress Notes (Signed)
Subjective:    Patient ID: April Banks, female    DOB: Jan 06, 1956, 57 y.o.   MRN: [id number]  HPI April Banks is a 57 y.o. female Last ov 04/25/12.   HTN - hx chronic kidney disease - nephrotic syndrome, IGA nephropathy.  Nephrologist: Dr. Joseph Berkshire at [facility].  - Q6 months. Outside labs from 05/04/13 nephrologist, no change in meds. see scanned copies. hgb 12.1, creatinine 1.57.(Range 1.2-1.7). PTH: 91. LFT's WNL. Needs refill of potassium citrate before seen by nephrologist in next few months. Outside BP's overall controlled on list brought in - few slight elevated readings - at time of stress.   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 Taking acyclovir $RemoveBeforeDE'200mg'COMekVNkHYwqRUp$  qd. No new side effects.   Anxiety, situational. Klonopin as needed - has had some increased social stressors recently with 8 year realtionship ending, aunt passed last week,  Grandmother is dying.  Started counseling early in August to stay ahead of anxiety symptoms, and feels like she is doing ok. Denies depressive sx's/SI/HI, and no Etoh. Has been having personal trainer sessions 3x/week, and water exercise 5 times per week. Had gained weight to 217 - now losing wt - 203 on home scale. Klonopin rarely. (30 lasted past few years).   Hyperlipidemia - On crestor 10 mg QD (had cramping with $RemoveBefor'20mg'ChfWuLolEMKg$  in past). Planning on lipids next visit with nephrology.  Crestor prescribed by nephrologist.   Patient Active Problem List   Diagnosis Date Noted  . Fat necrosis (segmental) of breast 02/15/2012  . HTN (hypertension) 08/25/2011  . MVP (mitral valve prolapse) 08/25/2011  . Herpes genitalis in women 08/25/2011  . Other and unspecified hyperlipidemia 09/04/2010  . OBESITY, UNSPECIFIED 09/04/2010  . ESSENTIAL HYPERTENSION, BENIGN 09/04/2010  . IGA NEPHROPATHY 07/31/2010   Past Medical History  Diagnosis Date  . Renal abscess   . Basal cell carcinoma of leg   . Nephrolithiasis   . Anemia   . Heart murmur   . Hyperlipidemia   .  Hypertension    Past Surgical History  Procedure Laterality Date  . Abdominal hysterectomy    . Arthroplasty w/ arthroscopy medial / lateral compartment knee    . Renal biopsy  2010  . Breast enhancement surgery  1990   Allergies  Allergen Reactions  . Gantrisin [Sulfisoxazole] Shortness Of Breath and Nausea Only  . Sulfa Antibiotics Shortness Of Breath and Rash  . Ceclor [Cefaclor] Rash  . Macrodantin Nausea And Vomiting  . Pyridium [Phenazopyridine Hcl]   . Pyridium [Phenazopyridine Hcl] Nausea Only   Prior to Admission medications   Medication Sig Start Date End Date Taking? Authorizing Provider  acyclovir (ZOVIRAX) 200 MG capsule TAKE ONE CAPSULE BY MOUTH EVERY DAY **NEEDS OFFICE VISIT** 09/16/12  Yes Collene Leyden, PA-C  aspirin 81 MG tablet Take 81 mg by mouth daily.     Yes Historical Provider, MD  clonazePAM (KLONOPIN) 0.5 MG tablet Take 0.5 mg by mouth 2 (two) times daily as needed for anxiety.   Yes Historical Provider, MD  diltiazem (CARDIZEM CD) 180 MG 24 hr capsule Take 180 mg by mouth daily.   Yes Historical Provider, MD  fish oil-omega-3 fatty acids 1000 MG capsule Take 2 g by mouth 3 (three) times daily.     Yes Historical Provider, MD  furosemide (LASIX) 20 MG tablet Take 3 tablets (60 mg total) by mouth daily. 09/14/11  Yes Wendie Agreste, MD  potassium citrate (UROCIT-K) 10 MEQ (1080 MG) SR tablet TAKE  1 TABLET BY MOUTH EVERY DAY BEFORE BREAKFAST 04/25/12  Yes Mancel Bale, PA-C  rosuvastatin (CRESTOR) 10 MG tablet Take 10 mg by mouth daily.   Yes Historical Provider, MD  valsartan (DIOVAN) 80 MG tablet Take 1 tablet (80 mg total) by mouth daily. 09/14/11  Yes Wendie Agreste, MD   History   Social History  . Marital Status: Divorced    Spouse Name: N/A    Number of Children: N/A  . Years of Education: N/A   Occupational History  . Not on file.   Social History Main Topics  . Smoking status: Never Smoker   . Smokeless tobacco: Not on file  . Alcohol Use:  0.0 oz/week     Comment: rare occasion  . Drug Use: No  . Sexual Activity: Not on file   Other Topics Concern  . Not on file   Social History Narrative  . No narrative on file    Review of Systems  Constitutional: Negative for fatigue and unexpected weight change.  Respiratory: Negative for chest tightness and shortness of breath.   Cardiovascular: Negative for chest pain, palpitations and leg swelling.  Gastrointestinal: Negative for abdominal pain and blood in stool.  Neurological: Negative for dizziness, syncope, light-headedness and headaches.  Psychiatric/Behavioral: Negative for suicidal ideas. The patient is not nervous/anxious.        Objective:   Physical Exam  Vitals reviewed. Constitutional: She is oriented to person, place, and time. She appears well-developed and well-nourished.  HENT:  Head: Normocephalic and atraumatic.  Eyes: Conjunctivae and EOM are normal. Pupils are equal, round, and reactive to light.  Neck: Carotid bruit is not present.  Cardiovascular: Normal rate, regular rhythm, normal heart sounds and intact distal pulses.   Pulmonary/Chest: Effort normal and breath sounds normal.  Abdominal: Soft. She exhibits no pulsatile midline mass. There is no tenderness.  Neurological: She is alert and oriented to person, place, and time.  Skin: Skin is warm and dry.  Psychiatric: She has a normal mood and affect. Her behavior is normal.   Filed Vitals:   07/13/13 1522  BP: 110/70  Pulse: 60  Temp: 99.7 F (37.6 C)  TempSrc: Oral  Resp: 16  Height: $Remove'5\' 4"'GjxyMLV$  (1.626 m)  Weight: 208 lb 9.6 oz (94.62 kg)  SpO2: 97%      Assessment & Plan:   April Banks is a 57 y.o. female Situational anxiety - Plan: clonazePAM (KLONOPIN) 0.5 MG tablet - stable, and already lined up counseling and working on stress mgt techniques. Klonopin if needed during acute stressful times.   HTN (hypertension),  IgA nephropathy - Plan: potassium citrate (UROCIT-K) 10 MEQ (1080  MG) SR tablet refilled until follow up with nephrology. Stable outside readings overall. No new med changes.   HSV-2 infection - Plan: acyclovir (ZOVIRAX) 200 MG capsule - no recent outbreak. Cont same dose.    Plan on CPE in next 3-6 months.   I Meds ordered this encounter  Medications  . DISCONTD: clonazePAM (KLONOPIN) 0.5 MG tablet    Sig: Take 0.5 mg by mouth 2 (two) times daily as needed for anxiety.  . clonazePAM (KLONOPIN) 0.5 MG tablet    Sig: Take 1 tablet (0.5 mg total) by mouth 2 (two) times daily as needed for anxiety.    Dispense:  30 tablet    Refill:  0  . potassium citrate (UROCIT-K) 10 MEQ (1080 MG) SR tablet    Sig: TAKE 1 TABLET BY MOUTH EVERY DAY  BEFORE BREAKFAST    Dispense:  90 tablet    Refill:  1  . acyclovir (ZOVIRAX) 200 MG capsule    Sig: TAKE ONE CAPSULE BY MOUTH EVERY DAY    Dispense:  90 capsule    Refill:  3   Patient Instructions  continue exercise, work with the counselor, and other stress management techniques. Return to the clinic or go to the nearest emergency room if any of your symptoms worsen or new symptoms occur.  Schedule physical in the next 3-6 months.

## 2013-07-13 NOTE — Patient Instructions (Signed)
continue exercise, work with the counselor, and other stress management techniques. Return to the clinic or go to the nearest emergency room if any of your symptoms worsen or new symptoms occur.  Schedule physical in the next 3-6 months.

## 2013-07-15 NOTE — Progress Notes (Signed)
Appointment scheduled for a cpe on 11/09/13.

## 2013-08-07 ENCOUNTER — Ambulatory Visit
Admission: RE | Admit: 2013-08-07 | Discharge: 2013-08-07 | Disposition: A | Discharge status: Home / Self Care | Payer: BC Managed Care – PPO | Source: Ambulatory Visit

## 2013-08-07 DIAGNOSIS — Z1231 Encounter for screening mammogram for malignant neoplasm of breast: Secondary | ICD-10-CM

## 2013-10-13 ENCOUNTER — Other Ambulatory Visit: Payer: Self-pay | Admitting: Physician Assistant

## 2013-11-09 ENCOUNTER — Encounter: Payer: BC Managed Care – PPO | Admitting: Family Medicine

## 2013-11-23 ENCOUNTER — Encounter: Payer: BC Managed Care – PPO | Admitting: Family Medicine

## 2014-03-19 ENCOUNTER — Ambulatory Visit (INDEPENDENT_AMBULATORY_CARE_PROVIDER_SITE_OTHER): Payer: BC Managed Care – PPO

## 2014-03-19 ENCOUNTER — Ambulatory Visit (INDEPENDENT_AMBULATORY_CARE_PROVIDER_SITE_OTHER): Payer: BC Managed Care – PPO | Admitting: Family Medicine

## 2014-03-19 VITALS — BP 132/70 | HR 90 | Temp 98.8°F | Resp 18 | Ht 64.0 in | Wt 207.0 lb

## 2014-03-19 DIAGNOSIS — N059 Unspecified nephritic syndrome with unspecified morphologic changes: Secondary | ICD-10-CM

## 2014-03-19 DIAGNOSIS — M79601 Pain in right arm: Secondary | ICD-10-CM

## 2014-03-19 DIAGNOSIS — R059 Cough, unspecified: Secondary | ICD-10-CM

## 2014-03-19 DIAGNOSIS — M545 Low back pain, unspecified: Secondary | ICD-10-CM

## 2014-03-19 DIAGNOSIS — R319 Hematuria, unspecified: Secondary | ICD-10-CM

## 2014-03-19 DIAGNOSIS — N02B9 Other recurrent and persistent immunoglobulin A nephropathy: Secondary | ICD-10-CM

## 2014-03-19 DIAGNOSIS — N390 Urinary tract infection, site not specified: Secondary | ICD-10-CM

## 2014-03-19 DIAGNOSIS — M79609 Pain in unspecified limb: Secondary | ICD-10-CM

## 2014-03-19 DIAGNOSIS — N028 Recurrent and persistent hematuria with other morphologic changes: Secondary | ICD-10-CM

## 2014-03-19 DIAGNOSIS — R05 Cough: Secondary | ICD-10-CM

## 2014-03-19 DIAGNOSIS — B9689 Other specified bacterial agents as the cause of diseases classified elsewhere: Secondary | ICD-10-CM

## 2014-03-19 DIAGNOSIS — J209 Acute bronchitis, unspecified: Secondary | ICD-10-CM

## 2014-03-19 DIAGNOSIS — A499 Bacterial infection, unspecified: Secondary | ICD-10-CM

## 2014-03-19 LAB — POCT CBC
Granulocyte percent: 80.6 % — AB (ref 37–80)
HCT, POC: 40.2 % (ref 37.7–47.9)
Hemoglobin: 12.2 g/dL (ref 12.2–16.2)
Lymph, poc: 1.3 (ref 0.6–3.4)
MCH, POC: 27.9 pg (ref 27–31.2)
MCHC: 30.4 g/dL — AB (ref 31.8–35.4)
MCV: 92 fL (ref 80–97)
MID (cbc): 0.4 (ref 0–0.9)
MPV: 8.9 fL (ref 0–99.8)
POC Granulocyte: 7.3 — AB (ref 2–6.9)
POC LYMPH PERCENT: 14.6 % (ref 10–50)
POC MID %: 4.8 %M (ref 0–12)
Platelet Count, POC: 243 10*3/uL (ref 142–424)
RBC: 4.37 M/uL (ref 4.04–5.48)
RDW, POC: 17.1 %
WBC: 9.1 10*3/uL (ref 4.6–10.2)

## 2014-03-19 LAB — POCT URINALYSIS DIPSTICK
Bilirubin, UA: NEGATIVE
Glucose, UA: NEGATIVE
Ketones, UA: NEGATIVE
Nitrite, UA: NEGATIVE
Protein, UA: >=300
Spec Grav, UA: 1.01
Urobilinogen, UA: 0.2
pH, UA: 5.5

## 2014-03-19 LAB — COMPLETE METABOLIC PANEL WITH GFR
AST: 14 U/L (ref 0–37)
Albumin: 4 g/dL (ref 3.5–5.2)
Alkaline Phosphatase: 92 U/L (ref 39–117)
BUN: 21 mg/dL (ref 6–23)
Calcium: 9.5 mg/dL (ref 8.4–10.5)
Creat: 1.65 mg/dL — ABNORMAL HIGH (ref 0.50–1.10)
GFR, Est Non African American: 34 mL/min — ABNORMAL LOW
Glucose, Bld: 123 mg/dL — ABNORMAL HIGH (ref 70–99)

## 2014-03-19 LAB — COMPLETE METABOLIC PANEL WITHOUT GFR
ALT: 12 U/L (ref 0–35)
CO2: 26 meq/L (ref 19–32)
Chloride: 105 meq/L (ref 96–112)
GFR, Est African American: 40 mL/min — ABNORMAL LOW
Potassium: 4.4 meq/L (ref 3.5–5.3)
Sodium: 142 meq/L (ref 135–145)
Total Bilirubin: 0.2 mg/dL (ref 0.2–1.2)
Total Protein: 6.3 g/dL (ref 6.0–8.3)

## 2014-03-19 LAB — POCT UA - MICROSCOPIC ONLY
Bacteria, U Microscopic: NEGATIVE
Casts, Ur, LPF, POC: NEGATIVE
Crystals, Ur, HPF, POC: NEGATIVE
Mucus, UA: NEGATIVE
Yeast, UA: NEGATIVE

## 2014-03-19 MED ORDER — CIPROFLOXACIN HCL 250 MG PO TABS: 250.0000 mg | ORAL_TABLET | Freq: Two times a day (BID) | ORAL | Status: DC

## 2014-03-19 NOTE — Patient Instructions (Addendum)
Acute Bronchitis Bronchitis is inflammation of the airways that extend from the windpipe into the lungs (bronchi). The inflammation often causes mucus to develop. This leads to a cough, which is the most common symptom of bronchitis.  In acute bronchitis, the condition usually develops suddenly and goes away over time, usually in a couple weeks. Smoking, allergies, and asthma can make bronchitis worse. Repeated episodes of bronchitis may cause further lung problems.  CAUSES Acute bronchitis is most often caused by the same virus that causes a cold. The virus can spread from person to person (contagious) through coughing, sneezing, and touching contaminated objects. SIGNS AND SYMPTOMS   Cough.   Fever.   Coughing up mucus.   Body aches.   Chest congestion.   Chills.   Shortness of breath.   Sore throat.  DIAGNOSIS  Acute bronchitis is usually diagnosed through a physical exam. Your health care provider will also ask you questions about your medical history. Tests, such as chest X-rays, are sometimes done to rule out other conditions.  TREATMENT  Acute bronchitis usually goes away in a couple weeks. Oftentimes, no medical treatment is necessary. Medicines are sometimes given for relief of fever or cough. Antibiotic medicines are usually not needed but may be prescribed in certain situations. In some cases, an inhaler may be recommended to help reduce shortness of breath and control the cough. A cool mist vaporizer may also be used to help thin bronchial secretions and make it easier to clear the chest.  HOME CARE INSTRUCTIONS  Get plenty of rest.   Drink enough fluids to keep your urine clear or pale yellow (unless you have a medical condition that requires fluid restriction). Increasing fluids may help thin your respiratory secretions (sputum) and reduce chest congestion, and it will prevent dehydration.   Take medicines only as directed by your health care provider.  If  you were prescribed an antibiotic medicine, finish it all even if you start to feel better.  Avoid smoking and secondhand smoke. Exposure to cigarette smoke or irritating chemicals will make bronchitis worse. If you are a smoker, consider using nicotine gum or skin patches to help control withdrawal symptoms. Quitting smoking will help your lungs heal faster.   Reduce the chances of another bout of acute bronchitis by washing your hands frequently, avoiding people with cold symptoms, and trying not to touch your hands to your mouth, nose, or eyes.   Keep all follow-up visits as directed by your health care provider.  SEEK MEDICAL CARE IF: Your symptoms do not improve after 1 week of treatment.  SEEK IMMEDIATE MEDICAL CARE IF:  You develop an increased fever or chills.   You have chest pain.   You have severe shortness of breath.  You have bloody sputum.   You develop dehydration.  You faint or repeatedly feel like you are going to pass out.  You develop repeated vomiting.  You develop a severe headache. MAKE SURE YOU:   Understand these instructions.  Will watch your condition.  Will get help right away if you are not doing well or get worse. Document Released: 09/06/2004 Document Revised: 12/14/2013 Document Reviewed: 01/20/2013 [facility] Patient Information 2015 [place], [place]. This information is not intended to replace advice given to you by your health care provider. Make sure you discuss any questions you have with your health care provider. Urinary Tract Infection A urinary tract infection (UTI) can occur any place along the urinary tract. The tract includes the kidneys, ureters, bladder, and  urethra. A type of germ called bacteria often causes a UTI. UTIs are often helped with antibiotic medicine.  HOME CARE   If given, take antibiotics as told by your doctor. Finish them even if you start to feel better.  Drink enough fluids to keep your pee (urine) clear or  pale yellow.  Avoid tea, drinks with caffeine, and bubbly (carbonated) drinks.  Pee often. Avoid holding your pee in for a long time.  Pee before and after having sex (intercourse).  Wipe from front to back after you poop (bowel movement) if you are a woman. Use each tissue only once. GET HELP RIGHT AWAY IF:   You have back pain.  You have lower belly (abdominal) pain.  You have chills.  You feel sick to your stomach (nauseous).  You throw up (vomit).  Your burning or discomfort with peeing does not go away.  You have a fever.  Your symptoms are not better in 3 days. MAKE SURE YOU:   Understand these instructions.  Will watch your condition.  Will get help right away if you are not doing well or get worse. Document Released: 01/16/2008 Document Revised: 04/23/2012 Document Reviewed: 02/28/2012 [facility] Patient Information 2015 [place], [place]. This information is not intended to replace advice given to you by your health care provider. Make sure you discuss any questions you have with your health care provider.

## 2014-03-19 NOTE — Progress Notes (Addendum)
Chief Complaint:  Chief Complaint  Patient presents with  . Nasal Congestion    since sunday  . Cough  . Hematuria    yesterday   . right arm pain    since sunday     HPI: April Banks is a 58 y.o. female who is here for  URI sxs for about 2-3 days, has some SOB, she has bilateral rib pain due to cough, she has some rattlingin  her chest, no color to it. She has IgA nephropathy, she has also had a lot bloody draiange from her nose when she was blowing but not much now. She is only on baby aspirin. She has had sick contacts. One of the host she was staying at in [place] was sick. He has primarily same sxs. Lots of fatigue since she has been back. She was in [place] short hunting trip. There was someone on the farm who'se daughter had died of TB, but she was not exposed to her, she was not really around the perosn whose daughter died but did have some contact with him. . She also has right arm pain, no shoulder pain, she is on crestor but has not been taking it. She feels like she got the urinary sxs same time as cold sxs. Additionally she has a Constant  Ache in right arm since Sunday, feels somewhat weaker but no other sxs.She may have fallen and bruised it. She has had blood in her urine since yesterday. She has no diarrhea, no fevers, no chills,night sweat or hemoptysis, leg pain/swelling.   Past Medical History  Diagnosis Date  . Renal abscess   . Basal cell carcinoma of leg   . Nephrolithiasis   . Anemia   . Heart murmur   . Hyperlipidemia   . Hypertension    Past Surgical History  Procedure Laterality Date  . Abdominal hysterectomy    . Arthroplasty w/ arthroscopy medial / lateral compartment knee    . Renal biopsy  2010  . Breast enhancement surgery  1990   History   Social History  . Marital Status: Divorced    Spouse Name: N/A    Number of Children: N/A  . Years of Education: N/A   Social History Main Topics  . Smoking status: Never Smoker   .  Smokeless tobacco: None  . Alcohol Use: 0.0 oz/week     Comment: rare occasion  . Drug Use: No  . Sexual Activity: None   Other Topics Concern  . None   Social History Narrative  . None   Family History  Problem Relation Age of Onset  . Heart disease Father   . Skin cancer Father   . Colon cancer Maternal Grandmother   . Breast cancer Maternal Aunt    Allergies  Allergen Reactions  . Gantrisin [Sulfisoxazole] Shortness Of Breath and Nausea Only  . Sulfa Antibiotics Shortness Of Breath and Rash  . Ceclor [Cefaclor] Rash  . Macrodantin Nausea And Vomiting  . Pyridium [Phenazopyridine Hcl]   . Pyridium [Phenazopyridine Hcl] Nausea Only   Prior to Admission medications   Medication Sig Start Date End Date Taking? Authorizing Provider  acetaminophen (TYLENOL) 500 MG tablet Take 500 mg by mouth every 6 (six) hours as needed.   Yes Historical Provider, MD  acyclovir (ZOVIRAX) 200 MG capsule TAKE ONE CAPSULE BY MOUTH EVERY DAY 07/13/13  Yes Wendie Agreste, MD  aspirin 81 MG tablet Take 81 mg by mouth daily.  Yes Historical Provider, MD  diltiazem (CARDIZEM CD) 180 MG 24 hr capsule Take 180 mg by mouth daily.   Yes Historical Provider, MD  fish oil-omega-3 fatty acids 1000 MG capsule Take 2 g by mouth 3 (three) times daily.     Yes Historical Provider, MD  furosemide (LASIX) 20 MG tablet Take 3 tablets (60 mg total) by mouth daily. 09/14/11  Yes Wendie Agreste, MD  potassium citrate (UROCIT-K) 10 MEQ (1080 MG) SR tablet TAKE 1 TABLET BY MOUTH EVERY DAY BEFORE BREAKFAST 07/13/13  Yes Wendie Agreste, MD  rosuvastatin (CRESTOR) 10 MG tablet Take 10 mg by mouth daily.   Yes Historical Provider, MD  valsartan (DIOVAN) 80 MG tablet Take 1 tablet (80 mg total) by mouth daily. 09/14/11  Yes Wendie Agreste, MD  clonazePAM (KLONOPIN) 0.5 MG tablet Take 1 tablet (0.5 mg total) by mouth 2 (two) times daily as needed for anxiety. 07/13/13   Wendie Agreste, MD     ROS: The patient denies  fevers, chills, night sweats, unintentional weight loss,  palpitations, wheezing, dyspnea on exertion, nausea, vomiting, abdominal pain, dysuria,  melena  All other systems have been reviewed and were otherwise negative with the exception of those mentioned in the HPI and as above.    PHYSICAL EXAM: Filed Vitals:   03/19/14 0816  BP: 132/70  Pulse: 90  Temp: 98.8 F (37.1 C)  Resp: 18   Filed Vitals:   03/19/14 0816  Height: $Remove'5\' 4"'QiLOPpj$  (1.626 m)  Weight: 207 lb (93.895 kg)   Body mass index is 35.51 kg/(m^2).  General: Alert, no acute distress HEENT:  Normocephalic, atraumatic, oropharynx patent. EOMI, PERRLA, Tm normal Cardiovascular:  Regular rate and rhythm, no rubs murmurs or gallops.  No Carotid bruits, radial pulse intact. No pedal edema.  Respiratory: Clear to auscultation bilaterally.  No wheezes, rales, or rhonchi.  No cyanosis, no use of accessory musculature GI: No organomegaly, abdomen is soft and non-tender, positive bowel sounds.  No masses. Skin: No rashes. Neurologic: Facial musculature symmetric. Psychiatric: Patient is appropriate throughout our interaction. Lymphatic: No cervical lymphadenopathy Musculoskeletal: Gait intact. No asymmeteric leg swelling Right shoulder exam-normal, full ROM, 5/5 strength +tender along right deltoid    LABS: Results for orders placed in visit on 03/19/14  POCT URINALYSIS DIPSTICK      Result Value Ref Range   Color, UA yellow     Clarity, UA clear     Glucose, UA neg     Bilirubin, UA neg     Ketones, UA neg     Spec Grav, UA 1.010     Blood, UA large     pH, UA 5.5     Protein, UA >=300     Urobilinogen, UA 0.2     Nitrite, UA neg     Leukocytes, UA Trace    POCT UA - MICROSCOPIC ONLY      Result Value Ref Range   WBC, Ur, HPF, POC 3-5     RBC, urine, microscopic 25-30     Bacteria, U Microscopic neg     Mucus, UA neg     Epithelial cells, urine per micros 0-1     Crystals, Ur, HPF, POC neg     Casts, Ur, LPF, POC  neg     Yeast, UA neg    POCT CBC      Result Value Ref Range   WBC 9.1  4.6 - 10.2 K/uL   Lymph, poc 1.3  0.6 - 3.4   POC LYMPH PERCENT 14.6  10 - 50 %L   MID (cbc) 0.4  0 - 0.9   POC MID % 4.8  0 - 12 %M   POC Granulocyte 7.3 (*) 2 - 6.9   Granulocyte percent 80.6 (*) 37 - 80 %G   RBC 4.37  4.04 - 5.48 M/uL   Hemoglobin 12.2  12.2 - 16.2 g/dL   HCT, POC 40.2  37.7 - 47.9 %   MCV 92.0  80 - 97 fL   MCH, POC 27.9  27 - 31.2 pg   MCHC 30.4 (*) 31.8 - 35.4 g/dL   RDW, POC 17.1     Platelet Count, POC 243  142 - 424 K/uL   MPV 8.9  0 - 99.8 fL     EKG/XRAY:   Primary read interpreted by Dr. Marin Comment at [facility]. Neg fracture/dislocation Increase vasc marking, + bronchitic changes vs less likely infiltrate   ASSESSMENT/PLAN: Encounter Diagnoses  Name Primary?  . Blood in urine   . Low back pain without sciatica, unspecified back pain laterality   . Cough   . Arm pain, anterior, right   . UTI (urinary tract infection), bacterial Yes  . IgA nephropathy   . Acute bronchitis, unspecified organism    Willr rx cipro low dose 250 mg BID with GFR 30-32% Will await for official xray results OTC nasaocort, she is worried about meds due to kidney disease. Will check CMP, Urine cx pending Sprain and strain of arm We discussed sxs of PE/DVT if worsening SOB, also possible TB test in 1 month F/u prn  Gross sideeffects, risk and benefits, and alternatives of medications d/w patient. Patient is aware that all medications have potential sideeffects and we are unable to predict every sideeffect or drug-drug interaction that may occur.  Deovion Batrez, [place], DO 03/19/2014 12:55 PM

## 2014-03-20 LAB — URINE CULTURE: Colony Count: 40000

## 2014-03-22 ENCOUNTER — Ambulatory Visit (INDEPENDENT_AMBULATORY_CARE_PROVIDER_SITE_OTHER): Payer: BC Managed Care – PPO | Admitting: Family Medicine

## 2014-03-22 ENCOUNTER — Encounter: Payer: Self-pay | Admitting: Family Medicine

## 2014-03-22 VITALS — BP 128/71 | HR 79 | Temp 99.2°F | Resp 18 | Ht 63.75 in | Wt 206.0 lb

## 2014-03-22 DIAGNOSIS — N028 Recurrent and persistent hematuria with other morphologic changes: Secondary | ICD-10-CM

## 2014-03-22 DIAGNOSIS — R059 Cough, unspecified: Secondary | ICD-10-CM

## 2014-03-22 DIAGNOSIS — N059 Unspecified nephritic syndrome with unspecified morphologic changes: Secondary | ICD-10-CM

## 2014-03-22 DIAGNOSIS — B349 Viral infection, unspecified: Secondary | ICD-10-CM

## 2014-03-22 DIAGNOSIS — R05 Cough: Secondary | ICD-10-CM

## 2014-03-22 DIAGNOSIS — B9789 Other viral agents as the cause of diseases classified elsewhere: Secondary | ICD-10-CM

## 2014-03-22 NOTE — Patient Instructions (Addendum)
mucinex as needed for cough. Rest, and recheck in 1 week. If blood in urine returns, cough worsens, cough that is productive with blood in it, fever over 100, night sweats or other worsening - return sooner for recheck.  You can stop Cipro after taking it for 5 days, but if any urinary symptoms or blood returns - return to clinic.   We will check a tb skin test today (baseline), and may need to repeat this weeks from now. We will check kidney test today, and plan on repeat chest xray in 1 week.   Return to the clinic or go to the nearest emergency room if any of your symptoms worsen or new symptoms occur.   Cough, Adult  A cough is a reflex that helps clear your throat and airways. It can help heal the body or may be a reaction to an irritated airway. A cough may only last 2 or 3 weeks (acute) or may last more than 8 weeks (chronic).  CAUSES Acute cough:  Viral or bacterial infections. Chronic cough:  Infections.  Allergies.  Asthma.  Post-nasal drip.  Smoking.  Heartburn or acid reflux.  Some medicines.  Chronic lung problems (COPD).  Cancer. SYMPTOMS   Cough.  Fever.  Chest pain.  Increased breathing rate.  High-pitched whistling sound when breathing (wheezing).  Colored mucus that you cough up (sputum). TREATMENT   A bacterial cough may be treated with antibiotic medicine.  A viral cough must run its course and will not respond to antibiotics.  Your caregiver may recommend other treatments if you have a chronic cough. HOME CARE INSTRUCTIONS   Only take over-the-counter or prescription medicines for pain, discomfort, or fever as directed by your caregiver. Use cough suppressants only as directed by your caregiver.  Use a cold steam vaporizer or humidifier in your bedroom or home to help loosen secretions.  Sleep in a semi-upright position if your cough is worse at night.  Rest as needed.  Stop smoking if you smoke. SEEK IMMEDIATE MEDICAL CARE IF:    You have pus in your sputum.  Your cough starts to worsen.  You cannot control your cough with suppressants and are losing sleep.  You begin coughing up blood.  You have difficulty breathing.  You develop pain which is getting worse or is uncontrolled with medicine.  You have a fever. MAKE SURE YOU:   Understand these instructions.  Will watch your condition.  Will get help right away if you are not doing well or get worse. Document Released: 01/26/2011 Document Revised: 10/22/2011 Document Reviewed: 01/26/2011 [facility] Patient Information 2015 [place], [place]. This information is not intended to replace advice given to you by your health care provider. Make sure you discuss any questions you have with your health care provider.

## 2014-03-22 NOTE — Progress Notes (Signed)
Subjective:   This chart was scribed for Wendie Agreste, MD by Forrestine Him, [facility] Scribe. This patient was seen in room 25 and the patient's care was started 1:31 PM.    Patient ID: April Banks, female    DOB: 1956/05/21, 58 y.o.   MRN: [id number]  Chief Complaint  Patient presents with   Annual Exam    HPI  HPI Comments: April Banks is a 58 y.o. female who presents to [facility] for a scheduled complete physical examination today. However, has acute illness. She was seen 3 days ago by Dr. Marin Comment with cough, congestion, SOB, some bloody nasal discharge but does take baby Aspirin (pt was present in a dry climate while in [place]). Also history of IGA nephropathy. Per Dr. Gus Puma note, pt has been hunting in [place] and one of the hosts was sick with similar symptoms. There was someone on the farm who's daughter had died due to TB and pneumonia just prior to her arrival. Denies any known contact. Pt was afebrile at last OV with a WBC of 9.1, urinalysis positive for blood 25-30 RBC per HPF. Chest X-Ray performed that showed no acute cardiopulmonary disease but mild L base subsegmental atelactasis. Did notice faint calcification in R breast (off note: mammogram without evidence of malignancy on 08/07/13). She was treated with Cipro 250 mg BID for possible UTI due to underlying renal insufficiency (EGFR 34 and creatinine 1.65). Creatinine range has been 1.2-1.7 and followed by Dr. Joseph Berkshire at [facility]. Urine culture returned with 40K colonies but multiple morphotypes present.  Pt admits to being present in a very dry climate while in [place]. States she was in [place], [place] 7 hours [place] of [place] from 7/24-8/5. She denies the presence of any mosquitoes while in the area due to the cold temperature. She denies any known insect bites. She does not recollect eating any unpasteurized cheeses. Today pt  reports ongoing, constant, moderate productive cough, chills, sore throat, fatigue, and muffled ears bilaterally onset 8 days. Pt states she noted recurrent hematuria 5 days ago but says this has now resolved. With history of IGA nephropathy, pt states she typically only experiences hematuria associated with a present illness. Did have some initial low back pain with onset of hematuria, however, this has resolved. She denies any night sweats, rash, or unexplained weight loss. Pt admits to a history of negative TB screening several years ago. Denies unpasteurized cheese consumption.  No tick bites.    Pt is requesting refills today.   Patient Active Problem List   Diagnosis Date Noted   Fat necrosis (segmental) of breast 02/15/2012   HTN (hypertension) 08/25/2011   MVP (mitral valve prolapse) 08/25/2011   Herpes genitalis in women 08/25/2011   Other and unspecified hyperlipidemia 09/04/2010   OBESITY, UNSPECIFIED 09/04/2010   ESSENTIAL HYPERTENSION, BENIGN 09/04/2010   IGA NEPHROPATHY 07/31/2010   Past Medical History  Diagnosis Date   Renal abscess    Basal cell carcinoma of leg    Nephrolithiasis    Anemia    Heart murmur    Hyperlipidemia    Hypertension    Past Surgical History  Procedure Laterality Date   Abdominal hysterectomy     Arthroplasty w/ arthroscopy medial / lateral compartment knee     Renal biopsy  2010   Breast enhancement surgery  1990   Allergies  Allergen Reactions   Gantrisin [Sulfisoxazole] Shortness  Of Breath and Nausea Only   Sulfa Antibiotics Shortness Of Breath and Rash   Ceclor [Cefaclor] Rash   Macrodantin Nausea And Vomiting   Pyridium [Phenazopyridine Hcl]    Pyridium [Phenazopyridine Hcl] Nausea Only   Prior to Admission medications   Medication Sig Start Date End Date Taking? Authorizing Provider  acetaminophen (TYLENOL) 500 MG tablet Take 500 mg by mouth every 6 (six) hours as needed.   Yes Historical Provider, MD    acyclovir (ZOVIRAX) 200 MG capsule TAKE ONE CAPSULE BY MOUTH EVERY DAY 07/13/13  Yes Wendie Agreste, MD  aspirin 81 MG tablet Take 81 mg by mouth daily.     Yes Historical Provider, MD  ciprofloxacin (CIPRO) 250 MG tablet Take 1 tablet (250 mg total) by mouth 2 (two) times daily. 03/19/14  Yes Thao P Le, DO  diltiazem (CARDIZEM CD) 180 MG 24 hr capsule Take 180 mg by mouth daily.   Yes Historical Provider, MD  fish oil-omega-3 fatty acids 1000 MG capsule Take 2 g by mouth 3 (three) times daily.     Yes Historical Provider, MD  furosemide (LASIX) 20 MG tablet Take 3 tablets (60 mg total) by mouth daily. 09/14/11  Yes Wendie Agreste, MD  potassium citrate (UROCIT-K) 10 MEQ (1080 MG) SR tablet TAKE 1 TABLET BY MOUTH EVERY DAY BEFORE BREAKFAST 07/13/13  Yes Wendie Agreste, MD  rosuvastatin (CRESTOR) 10 MG tablet Take 10 mg by mouth daily.   Yes Historical Provider, MD  valsartan (DIOVAN) 80 MG tablet Take 1 tablet (80 mg total) by mouth daily. 09/14/11  Yes Wendie Agreste, MD  clonazePAM (KLONOPIN) 0.5 MG tablet Take 1 tablet (0.5 mg total) by mouth 2 (two) times daily as needed for anxiety. 07/13/13   Wendie Agreste, MD   History   Social History   Marital Status: Divorced    Spouse Name: N/A    Number of Children: N/A   Years of Education: N/A   Occupational History   Not on file.   Social History Main Topics   Smoking status: Never Smoker    Smokeless tobacco: Not on file   Alcohol Use: 0.0 oz/week     Comment: rare occasion   Drug Use: No   Sexual Activity: Not on file   Other Topics Concern   Not on file   Social History Narrative   No narrative on file     Review of Systems  Constitutional: Positive for fever, chills and fatigue. Negative for diaphoresis.  HENT: Positive for congestion and sore throat.   Respiratory: Positive for cough.     Filed Vitals:   03/22/14 1310  BP: 128/71  Pulse: 79  Temp: 99.2 F (37.3 C)  TempSrc: Oral  Resp: 18  Height:  5' 3.75" (1.619 m)  Weight: 206 lb (93.441 kg)  SpO2: 97%    Objective:  Physical Exam  Vitals reviewed. Constitutional: She is oriented to person, place, and time. She appears well-developed and well-nourished. No distress.  HENT:  Head: Normocephalic and atraumatic.  Right Ear: Hearing, tympanic membrane, external ear and ear canal normal.  Left Ear: Hearing, tympanic membrane, external ear and ear canal normal.  Nose: Nose normal.  Mouth/Throat: Oropharynx is clear and moist. No oropharyngeal exudate.  Eyes: Conjunctivae and EOM are normal. Pupils are equal, round, and reactive to light.  Cardiovascular: Normal rate, regular rhythm, normal heart sounds and intact distal pulses.   No murmur heard. Pulmonary/Chest: Effort normal and breath sounds normal.  No respiratory distress. She has no wheezes. She has no rhonchi.  Lungs clear, no distress but coughs during visit  Abdominal: Soft. Bowel sounds are normal. She exhibits no distension. There is no tenderness.  Neurological: She is alert and oriented to person, place, and time.  Skin: Skin is warm and dry. No rash noted.  Psychiatric: She has a normal mood and affect. Her behavior is normal.     Assessment & Plan:  April Banks is a 58 y.o. female Cough - Plan: COMPLETE METABOLIC PANEL WITH GFR, TB Skin Test  Viral syndrome - Plan: COMPLETE METABOLIC PANEL WITH GFR, TB Skin Test  IgA nephropathy - Plan: COMPLETE METABOLIC PANEL WITH GFR, TB Skin Test  Suspected viral syndrome, with initial sore throat/URI sx's.  However with recent travel and possible TB exposure - place PPD.  Discussed with infectious disease. With current symptoms - did not sound like active TB, but RTC precautions were discussed, specifically night sweats, blood tinged mucus, or other worsening. Plan on recheck CXR and OV in 1 week.   Regarding her hematuria - has had flairs of hematuria with underling IGA nephropathy during acute illness, but now resolved  and no definitive infection on urine cx. Can stop cipro after 5 days use, but RTC if hematuria persists. CMP pending to eval creatinine.   Will plan on rescheduling CPE, and refill necessary meds until then.   No orders of the defined types were placed in this encounter.   Patient Instructions  mucinex as needed for cough. Rest, and recheck in 1 week. If blood in urine returns, cough worsens, cough that is productive with blood in it, fever over 100, night sweats or other worsening - return sooner for recheck.  You can stop Cipro after taking it for 5 days, but if any urinary symptoms or blood returns - return to clinic.   We will check a tb skin test today (baseline), and may need to repeat this weeks from now. We will check kidney test today, and plan on repeat chest xray in 1 week.   Return to the clinic or go to the nearest emergency room if any of your symptoms worsen or new symptoms occur.   Cough, Adult  A cough is a reflex that helps clear your throat and airways. It can help heal the body or may be a reaction to an irritated airway. A cough may only last 2 or 3 weeks (acute) or may last more than 8 weeks (chronic).  CAUSES Acute cough:  Viral or bacterial infections. Chronic cough:  Infections.  Allergies.  Asthma.  Post-nasal drip.  Smoking.  Heartburn or acid reflux.  Some medicines.  Chronic lung problems (COPD).  Cancer. SYMPTOMS   Cough.  Fever.  Chest pain.  Increased breathing rate.  High-pitched whistling sound when breathing (wheezing).  Colored mucus that you cough up (sputum). TREATMENT   A bacterial cough may be treated with antibiotic medicine.  A viral cough must run its course and will not respond to antibiotics.  Your caregiver may recommend other treatments if you have a chronic cough. HOME CARE INSTRUCTIONS   Only take over-the-counter or prescription medicines for pain, discomfort, or fever as directed by your caregiver. Use  cough suppressants only as directed by your caregiver.  Use a cold steam vaporizer or humidifier in your bedroom or home to help loosen secretions.  Sleep in a semi-upright position if your cough is worse at night.  Rest as needed.  Stop  smoking if you smoke. SEEK IMMEDIATE MEDICAL CARE IF:   You have pus in your sputum.  Your cough starts to worsen.  You cannot control your cough with suppressants and are losing sleep.  You begin coughing up blood.  You have difficulty breathing.  You develop pain which is getting worse or is uncontrolled with medicine.  You have a fever. MAKE SURE YOU:   Understand these instructions.  Will watch your condition.  Will get help right away if you are not doing well or get worse. Document Released: 01/26/2011 Document Revised: 10/22/2011 Document Reviewed: 01/26/2011 [facility] Patient Information 2015 [place], [place]. This information is not intended to replace advice given to you by your health care provider. Make sure you discuss any questions you have with your health care provider.      I personally performed the services described in this documentation, which was scribed in my presence. The recorded information has been reviewed and is accurate.

## 2014-03-22 NOTE — Progress Notes (Signed)
  Tuberculosis Risk Questionnaire  1. n Were you born outside the [place] in one of the following parts of the world: [place], [place], [place], [place] or [place]?    2. no} Have you traveled outside the [place] and lived for more than one month in one of the following parts of the world: [place], [place], [place], [place] or [place]?    3. No Do you have a compromised immune system such as from any of the following conditions:HIV/AIDS, organ or bone marrow transplantation, diabetes, immunosuppressive medicines (e.g. Prednisone, Remicaide), leukemia, lymphoma, cancer of the head or neck, gastrectomy or jejunal bypass, end-stage renal disease (on dialysis), or silicosis?     4. No Have you ever or do you plan on working in: a residential care center, a health care facility, a jail or prison or homeless shelter?    5. No Have you ever: injected illegal drugs, used crack cocaine, lived in a homeless shelter  or been in jail or prison?     6. Yes  Have you ever been exposed to anyone with infectious tuberculosis?    Tuberculosis Symptom Questionnaire  Do you currently have any of the following symptoms?  1. Yes  Unexplained cough lasting more than 3 weeks?   2. No Unexplained fever lasting more than 3 weeks.   3. No Night Sweats (sweating that leaves the bedclothes and sheets wet)     4. No Shortness of Breath   5. No Chest Pain   6. No Unintentional weight loss    7. Yes  Unexplained fatigue (very tired for no reason)

## 2014-03-23 LAB — COMPLETE METABOLIC PANEL WITH GFR
ALK PHOS: 99 U/L (ref 39–117)
ALT: 13 U/L (ref 0–35)
AST: 13 U/L (ref 0–37)
Albumin: 4.1 g/dL (ref 3.5–5.2)
BILIRUBIN TOTAL: 0.2 mg/dL (ref 0.2–1.2)
BUN: 32 mg/dL — AB (ref 6–23)
CO2: 29 mEq/L (ref 19–32)
CREATININE: 1.75 mg/dL — AB (ref 0.50–1.10)
Calcium: 9.5 mg/dL (ref 8.4–10.5)
Chloride: 102 mEq/L (ref 96–112)
GFR, EST NON AFRICAN AMERICAN: 32 mL/min — AB
GFR, Est African American: 37 mL/min — ABNORMAL LOW
GLUCOSE: 96 mg/dL (ref 70–99)
Potassium: 4.5 mEq/L (ref 3.5–5.3)
Sodium: 140 mEq/L (ref 135–145)
Total Protein: 6.1 g/dL (ref 6.0–8.3)

## 2014-03-24 ENCOUNTER — Telehealth: Payer: Self-pay

## 2014-03-24 ENCOUNTER — Ambulatory Visit (INDEPENDENT_AMBULATORY_CARE_PROVIDER_SITE_OTHER): Payer: BC Managed Care – PPO | Admitting: Radiology

## 2014-03-24 DIAGNOSIS — Z111 Encounter for screening for respiratory tuberculosis: Secondary | ICD-10-CM

## 2014-03-24 DIAGNOSIS — Z09 Encounter for follow-up examination after completed treatment for conditions other than malignant neoplasm: Secondary | ICD-10-CM

## 2014-03-24 LAB — TB SKIN TEST
Induration: 0 mm
TB SKIN TEST: NEGATIVE

## 2014-03-24 NOTE — Telephone Encounter (Signed)
Message copied by Constance Goltz on Wed Mar 24, 2014  6:21 PM ------      Message from: Carlota Raspberry, [place]      Created: Wed Mar 24, 2014  2:42 PM       Patient seen Monday for acute illness, but may need med refills.  PLan on repeat OV this Monday, but please check to see if any refills need to be sent in sooner.  ------

## 2014-03-24 NOTE — Telephone Encounter (Signed)
Called pt. She is good on her meds until she see's you on Monday

## 2014-03-25 ENCOUNTER — Other Ambulatory Visit: Payer: Self-pay | Admitting: Family Medicine

## 2014-03-25 ENCOUNTER — Other Ambulatory Visit (INDEPENDENT_AMBULATORY_CARE_PROVIDER_SITE_OTHER): Payer: BC Managed Care – PPO | Admitting: *Deleted

## 2014-03-25 DIAGNOSIS — R7989 Other specified abnormal findings of blood chemistry: Secondary | ICD-10-CM

## 2014-03-25 DIAGNOSIS — R059 Cough, unspecified: Secondary | ICD-10-CM

## 2014-03-25 DIAGNOSIS — R05 Cough: Secondary | ICD-10-CM

## 2014-03-25 DIAGNOSIS — N028 Recurrent and persistent hematuria with other morphologic changes: Secondary | ICD-10-CM

## 2014-03-25 DIAGNOSIS — N02B9 Other recurrent and persistent immunoglobulin A nephropathy: Secondary | ICD-10-CM

## 2014-03-25 DIAGNOSIS — B9789 Other viral agents as the cause of diseases classified elsewhere: Secondary | ICD-10-CM

## 2014-03-25 DIAGNOSIS — N059 Unspecified nephritic syndrome with unspecified morphologic changes: Secondary | ICD-10-CM

## 2014-03-26 LAB — COMPREHENSIVE METABOLIC PANEL WITH GFR
AST: 12 U/L (ref 0–37)
Albumin: 3.8 g/dL (ref 3.5–5.2)
Alkaline Phosphatase: 98 U/L (ref 39–117)
Potassium: 4.3 meq/L (ref 3.5–5.3)
Sodium: 140 meq/L (ref 135–145)
Total Protein: 6 g/dL (ref 6.0–8.3)

## 2014-03-26 LAB — COMPREHENSIVE METABOLIC PANEL
ALT: 15 U/L (ref 0–35)
BUN: 40 mg/dL — ABNORMAL HIGH (ref 6–23)
CO2: 26 mEq/L (ref 19–32)
Calcium: 9.1 mg/dL (ref 8.4–10.5)
Chloride: 103 mEq/L (ref 96–112)
Creat: 1.64 mg/dL — ABNORMAL HIGH (ref 0.50–1.10)
Glucose, Bld: 119 mg/dL — ABNORMAL HIGH (ref 70–99)
Total Bilirubin: 0.2 mg/dL (ref 0.2–1.2)

## 2014-03-26 NOTE — Telephone Encounter (Signed)
Creatinine trending down, from 1.75 to 1.64 today

## 2014-03-28 ENCOUNTER — Encounter (HOSPITAL_COMMUNITY): Payer: BC Managed Care – PPO

## 2014-03-28 ENCOUNTER — Ambulatory Visit (INDEPENDENT_AMBULATORY_CARE_PROVIDER_SITE_OTHER): Payer: BC Managed Care – PPO | Admitting: Family Medicine

## 2014-03-28 ENCOUNTER — Ambulatory Visit (HOSPITAL_COMMUNITY)
Admission: RE | Admit: 2014-03-28 | Discharge: 2014-03-28 | Disposition: A | Discharge status: Home / Self Care | Payer: BC Managed Care – PPO | Source: Ambulatory Visit | Attending: Family Medicine | Admitting: Family Medicine

## 2014-03-28 VITALS — BP 142/80 | HR 91 | Temp 98.8°F | Resp 18 | Ht 63.75 in | Wt 206.4 lb

## 2014-03-28 DIAGNOSIS — IMO0001 Reserved for inherently not codable concepts without codable children: Secondary | ICD-10-CM

## 2014-03-28 DIAGNOSIS — M791 Myalgia, unspecified site: Secondary | ICD-10-CM

## 2014-03-28 DIAGNOSIS — M79609 Pain in unspecified limb: Secondary | ICD-10-CM

## 2014-03-28 DIAGNOSIS — M79601 Pain in right arm: Secondary | ICD-10-CM

## 2014-03-28 LAB — CK: Total CK: 39 U/L (ref 7–177)

## 2014-03-28 LAB — TSH: TSH: 1.11 u[IU]/mL (ref 0.350–4.500)

## 2014-03-28 LAB — POCT SEDIMENTATION RATE: POCT SED RATE: 40 mm/hr — AB (ref 0–22)

## 2014-03-28 LAB — MAGNESIUM: Magnesium: 2.2 mg/dL (ref 1.5–2.5)

## 2014-03-28 LAB — C-REACTIVE PROTEIN

## 2014-03-28 LAB — PHOSPHORUS: Phosphorus: 3.9 mg/dL (ref 2.3–4.6)

## 2014-03-28 MED ORDER — CYCLOBENZAPRINE HCL 10 MG PO TABS: 10.0000 mg | ORAL_TABLET | Freq: Three times a day (TID) | ORAL | Status: DC | PRN

## 2014-03-28 MED ORDER — HYDROCODONE-ACETAMINOPHEN 5-325 MG PO TABS: 1.0000 | ORAL_TABLET | Freq: Four times a day (QID) | ORAL | Status: DC | PRN

## 2014-03-28 NOTE — Progress Notes (Signed)
Subjective:    Patient ID: April Banks, female    DOB: 1955/11/28, 58 y.o.   MRN: [id number]  Arm Pain  Pertinent negatives include no numbness.   Chief Complaint  Patient presents with  . Arm Pain    Right Arm   This chart was scribed for Delman Cheadle, MD by Thea Alken, ED Scribe. This patient was seen in room 14 and the patient's care was started at 9:42 AM.  HPI Comments: April Banks is a 58 y.o. female who presents to the [facility] complaining of non radiating, worsening right arm pain. Pt states she was seen by Dr. Marin Comment 9 days ago for the same complaint where she received an X-Ray of humorous. She reports recent trip to [place] returning with bronchitis in which she was seen by Dr. Carlota Raspberry. Pt reports she fell in [place] while hiking but denied pain that time. She describes pain as nagging and uncomfortable, rating pain 4-5/10. She reports pain worsened last night rating the pain 9/10 keeping her awake at night. She reports weakness in arm with lift heavy objects. Pt states she has chills, possibly from prior bronchitis. Pt reports she took tylenol with mild relief last night.  Pt denies h/o carpel tunnel and neck problems. She denies MVA.   Pt denies numbness and tingling.  Pt is prescribed crestor but reports she does not take medication. She reports taking crestor about 3-4 times within the last month.   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  Pt has h/o chronic kidney disease, nephrotic syndrome states see's nephrologist, Dr. Joseph Berkshire at [facility].   Vascular tech said doppler was normal. Small game hunting might have trigger arm pain  Past Medical History  Diagnosis Date  . Renal abscess   . Basal cell carcinoma of leg   . Nephrolithiasis   . Anemia   . Heart murmur   . Hyperlipidemia   . Hypertension    Past Surgical History  Procedure Laterality Date  . Abdominal hysterectomy    . Arthroplasty w/ arthroscopy medial /  lateral compartment knee    . Renal biopsy  2010  . Breast enhancement surgery  1990   Prior to Admission medications   Medication Sig Start Date End Date Taking? Authorizing Provider  acetaminophen (TYLENOL) 500 MG tablet Take 500 mg by mouth every 6 (six) hours as needed.   Yes Historical Provider, MD  acyclovir (ZOVIRAX) 200 MG capsule TAKE ONE CAPSULE BY MOUTH EVERY DAY 07/13/13  Yes Wendie Agreste, MD  aspirin 81 MG tablet Take 81 mg by mouth daily.     Yes Historical Provider, MD  ciprofloxacin (CIPRO) 250 MG tablet Take 1 tablet (250 mg total) by mouth 2 (two) times daily. 03/19/14  Yes Thao P Le, DO  clonazePAM (KLONOPIN) 0.5 MG tablet Take 1 tablet (0.5 mg total) by mouth 2 (two) times daily as needed for anxiety. 07/13/13  Yes Wendie Agreste, MD  diltiazem (CARDIZEM CD) 180 MG 24 hr capsule Take 180 mg by mouth daily.   Yes Historical Provider, MD  fish oil-omega-3 fatty acids 1000 MG capsule Take 2 g by mouth 3 (three) times daily.     Yes Historical Provider, MD  furosemide (LASIX) 20 MG tablet Take 3 tablets (60 mg total) by mouth daily. 09/14/11  Yes Wendie Agreste, MD  potassium citrate (UROCIT-K) 10 MEQ (1080 MG) SR tablet TAKE 1  TABLET BY MOUTH EVERY DAY BEFORE BREAKFAST 07/13/13  Yes Shade Flood, MD  rosuvastatin (CRESTOR) 10 MG tablet Take 10 mg by mouth daily.   Yes Historical Provider, MD  valsartan (DIOVAN) 80 MG tablet Take 1 tablet (80 mg total) by mouth daily. 09/14/11  Yes Shade Flood, MD   Review of Systems  Constitutional: Positive for chills. Negative for fever.  Musculoskeletal: Positive for myalgias. Negative for neck pain and neck stiffness.  Neurological: Positive for weakness. Negative for numbness.      BP 142/80  Pulse 91  Temp(Src) 98.8 F (37.1 C) (Oral)  Resp 18  Ht 5' 3.75" (1.619 m)  Wt 206 lb 6.4 oz (93.622 kg)  BMI 35.72 kg/m2  SpO2 99% Objective:   Physical Exam  Nursing note and vitals reviewed. Constitutional: She is oriented to  person, place, and time. She appears well-developed and well-nourished. No distress.  HENT:  Head: Normocephalic and atraumatic.  Eyes: Conjunctivae and EOM are normal.  Neck: Neck supple.  Cardiovascular: Normal rate.   Pulses:      Radial pulses are 2+ on the right side, and 2+ on the left side.  2+ brachial and ulnar pulses.  Pulmonary/Chest: Effort normal.  Musculoskeletal: Normal range of motion.  Pain to posterior aspect of right arm. No cervical spinous tenderness. Neg Spurling . Right tricep pain with all movement of arm including flexion, extension an abduction of shoulder. 5/5 strength on all UE.    Neurological: She is alert and oriented to person, place, and time.  Skin: Skin is warm and dry.  Mild blanching macular serpiginous erythematous rash on both right and left arm worse on right.   Psychiatric: She has a normal mood and affect. Her behavior is normal.   Assessment & Plan:   1. Myalgia   2. Right arm pain    Meds ordered this encounter  Medications  . cyclobenzaprine (FLEXERIL) 10 MG tablet    Sig: Take 1 tablet (10 mg total) by mouth 3 (three) times daily as needed for muscle spasms.    Dispense:  30 tablet    Refill:  0  . HYDROcodone-acetaminophen (NORCO/VICODIN) 5-325 MG per tablet    Sig: Take 1 tablet by mouth every 6 (six) hours as needed for moderate pain.    Dispense:  30 tablet    Refill:  0    Treatment plan: Doppler, therapeutic muscle relaxer and a trial of pain medication over night if she does not respond to muscle relaxer. F/u in clinic tomorrow. Will obtain labs for inflammatory disease and rule out DVT with venous doppler localized infection for pyomyositis unlikely due to lack of swelling and erythema however due to severity of tenderness may need to consider muscle infarction. If no muscle pain is found on lab repeat CMP and CBC tomorrow at that time. If CBC is normal may want to try prednisone or consider MRI.  Orders Placed This Encounter    Procedures  . CK  . C-reactive protein  . Vit D  25 hydroxy (rtn osteoporosis monitoring)  . Magnesium  . TSH  . Phosphorus  . POCT SEDIMENTATION RATE  . Upper extremity Venous Duplex Right    Standing Status: Future     Number of Occurrences: 1     Standing Expiration Date: 03/29/2015    Order Specific Question:  Laterality    Answer:  Right    Order Specific Question:  Where should this test be performed:    Answer:  [facility]    I personally performed the services described in this documentation, which was scribed in my presence. The recorded information has been reviewed and considered, and addended by me as needed.  Delman Cheadle, MD MPH  Results for orders placed in visit on 03/28/14  CK      Result Value Ref Range   Total CK 39  7 - 177 U/L  C-REACTIVE PROTEIN      Result Value Ref Range   CRP <0.5  <0.60 mg/dL  VITAMIN D 25 HYDROXY      Result Value Ref Range   Vit D, 25-Hydroxy 71  30 - 89 ng/mL  MAGNESIUM      Result Value Ref Range   Magnesium 2.2  1.5 - 2.5 mg/dL  TSH      Result Value Ref Range   TSH 1.110  0.350 - 4.500 uIU/mL  PHOSPHORUS      Result Value Ref Range   Phosphorus 3.9  2.3 - 4.6 mg/dL  POCT SEDIMENTATION RATE      Result Value Ref Range   POCT SED RATE 40 (*) 0 - 22 mm/hr

## 2014-03-28 NOTE — Progress Notes (Signed)
VASCULAR LAB PRELIMINARY  PRELIMINARY  PRELIMINARY  PRELIMINARY  Right upper extremity venous duplex completed.    Preliminary report:  Right:  No evidence of DVT or superficial thrombosis.    Amyri Frenz, RVS 03/28/2014, 2:49 PM

## 2014-03-28 NOTE — Patient Instructions (Signed)
Lets stop your crestor - this is unlikely to be the cause of just one side of your muscle aches but not out of the realm of possibility

## 2014-03-29 ENCOUNTER — Encounter: Payer: Self-pay | Admitting: Family Medicine

## 2014-03-29 ENCOUNTER — Ambulatory Visit (INDEPENDENT_AMBULATORY_CARE_PROVIDER_SITE_OTHER): Payer: BC Managed Care – PPO | Admitting: Family Medicine

## 2014-03-29 VITALS — BP 120/80 | HR 74 | Temp 98.7°F | Resp 16 | Ht 64.0 in | Wt 208.6 lb

## 2014-03-29 DIAGNOSIS — M542 Cervicalgia: Secondary | ICD-10-CM

## 2014-03-29 DIAGNOSIS — J40 Bronchitis, not specified as acute or chronic: Secondary | ICD-10-CM

## 2014-03-29 DIAGNOSIS — N028 Recurrent and persistent hematuria with other morphologic changes: Secondary | ICD-10-CM

## 2014-03-29 DIAGNOSIS — B009 Herpesviral infection, unspecified: Secondary | ICD-10-CM

## 2014-03-29 DIAGNOSIS — M25521 Pain in right elbow: Secondary | ICD-10-CM

## 2014-03-29 DIAGNOSIS — M25529 Pain in unspecified elbow: Secondary | ICD-10-CM

## 2014-03-29 DIAGNOSIS — I158 Other secondary hypertension: Secondary | ICD-10-CM

## 2014-03-29 DIAGNOSIS — I10 Essential (primary) hypertension: Secondary | ICD-10-CM

## 2014-03-29 DIAGNOSIS — N059 Unspecified nephritic syndrome with unspecified morphologic changes: Secondary | ICD-10-CM

## 2014-03-29 LAB — VITAMIN D 25 HYDROXY (VIT D DEFICIENCY, FRACTURES): Vit D, 25-Hydroxy: 71 ng/mL (ref 30–89)

## 2014-03-29 MED ORDER — POTASSIUM CITRATE ER 10 MEQ (1080 MG) PO TBCR: EXTENDED_RELEASE_TABLET | ORAL | Status: DC

## 2014-03-29 MED ORDER — DILTIAZEM HCL ER COATED BEADS 180 MG PO CP24: 180.0000 mg | ORAL_CAPSULE | Freq: Every day | ORAL | Status: DC

## 2014-03-29 MED ORDER — ACYCLOVIR 200 MG PO CAPS: ORAL_CAPSULE | ORAL | Status: DC

## 2014-03-29 MED ORDER — FUROSEMIDE 20 MG PO TABS: 60.0000 mg | ORAL_TABLET | Freq: Every day | ORAL | Status: DC

## 2014-03-29 MED ORDER — VALSARTAN 80 MG PO TABS: 80.0000 mg | ORAL_TABLET | Freq: Every day | ORAL | Status: DC

## 2014-03-29 NOTE — Patient Instructions (Signed)
Return for recheck cough if not improved in next 2 weeks - if fever, night sweats, or any blood in cough - return right away.  1/2 to 1 of the flexeril as needed. See the neck care manual.  If arm and neck not improving in next 2 weeks - recheck. Return to the clinic or go to the nearest emergency room if any of your symptoms worsen or new symptoms occur.  Fasting labs with Dr. Joseph Berkshire as scheduled. Ok to restart crestor as arm pain resolves.  Plan on physical in next 3 months to review other health maintenance items.

## 2014-03-29 NOTE — Progress Notes (Signed)
Subjective:    Patient ID: April Banks, female    DOB: [birth year], 57 y.o.   MRN: [id number]  HPI April Banks is a 58 y.o. female  Hx of HTN, IGA nephropathy. See prior OV"s. Unable to complete CPE last week d/t current illness.   HTN, IGA nephropathy - home BP's:120-130/70's, no new side effects of meds. . Dr. Joseph Berkshire at [facility] - nephrologist. appt end of September. No missed doses of BP meds.   Bronchitis - Seen 1 week ago with cough illness after overseas travel ([place]). Seen by Dr. Marin Comment prior suspected viral illness. PPD placed last ov. Initial CXR - left base subsegmental atelectasis.  Lab Results  Component Value Date   PPD Negative 03/24/2014  cough improved.  No fever. Dry cough, and lessening. Slight congestion in ear. But feels better. No meds needed.   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  Acyclovir $RemoveBe'200mg'WWryBigRc$  once per day.   R upper arm pain - seen by Dr. Brigitte Pulse yesterday, and Dr. Marin Comment initially - doppler yesterday negative for DVT. Fall when hiking during travel recently, but did not have any pain then.  XR of humerus negative for fracture 03/19/14. Started flexeril yesterday. Sedated with this. Upper arm. Not shoulder. No neck pain. Had a few sore nights, then improved, then worsened 2 days ago.  Did not need to take the hydrocodone.  No dyspnea, no chest pains. Does feel like R arm a little less strong. Tests from yesterday:  Results for orders placed in visit on 03/28/14  CK      Result Value Ref Range   Total CK 39  7 - 177 U/L  C-REACTIVE PROTEIN      Result Value Ref Range   CRP <0.5  <0.60 mg/dL  VITAMIN D 25 HYDROXY      Result Value Ref Range   Vit D, 25-Hydroxy 71  30 - 89 ng/mL  MAGNESIUM      Result Value Ref Range   Magnesium 2.2  1.5 - 2.5 mg/dL  TSH      Result Value Ref Range   TSH 1.110  0.350 - 4.500 uIU/mL  PHOSPHORUS      Result Value Ref Range   Phosphorus 3.9  2.3 - 4.6 mg/dL  POCT SEDIMENTATION RATE      Result Value Ref  Range   POCT SED RATE 40 (*) 0 - 22 mm/hr   Hyperlipidemia - infrequent doses prior - 3-4 times per month. .  No intolerance, just forgets to take. Holding on this until arm pain improves. Not fasting today. Plans on fasting labs at Dr. Joseph Berkshire end of September. Plans on lipids then.  Lab Results  Component Value Date   CHOL 236* 09/17/2011   HDL 70 09/17/2011   LDLCALC 143* 09/17/2011   TRIG 116 09/17/2011   CHOLHDL 3.4 09/17/2011      Patient Active Problem List   Diagnosis Date Noted  . Fat necrosis (segmental) of breast 02/15/2012  . HTN (hypertension) 08/25/2011  . MVP (mitral valve prolapse) 08/25/2011  . Herpes genitalis in women 08/25/2011  . Other and unspecified hyperlipidemia 09/04/2010  . OBESITY, UNSPECIFIED 09/04/2010  . ESSENTIAL HYPERTENSION, BENIGN 09/04/2010  . IGA NEPHROPATHY 07/31/2010   Past Medical History  Diagnosis Date  . Renal abscess   . Basal cell carcinoma of leg   . Nephrolithiasis   . Anemia   . Heart murmur   . Hyperlipidemia   .  Hypertension    Past Surgical History  Procedure Laterality Date  . Abdominal hysterectomy    . Arthroplasty w/ arthroscopy medial / lateral compartment knee    . Renal biopsy  2010  . Breast enhancement surgery  1990   Allergies  Allergen Reactions  . Gantrisin [Sulfisoxazole] Shortness Of Breath and Nausea Only  . Sulfa Antibiotics Shortness Of Breath and Rash  . Ceclor [Cefaclor] Rash  . Macrodantin Nausea And Vomiting  . Pyridium [Phenazopyridine Hcl]   . Pyridium [Phenazopyridine Hcl] Nausea Only   Prior to Admission medications   Medication Sig Start Date End Date Taking? Authorizing Provider  acetaminophen (TYLENOL) 500 MG tablet Take 500 mg by mouth every 6 (six) hours as needed.   Yes Historical Provider, MD  acyclovir (ZOVIRAX) 200 MG capsule TAKE ONE CAPSULE BY MOUTH EVERY DAY 07/13/13  Yes Shade Flood, MD  aspirin 81 MG tablet Take 81 mg by mouth daily.     Yes Historical Provider, MD    cyclobenzaprine (FLEXERIL) 10 MG tablet Take 1 tablet (10 mg total) by mouth 3 (three) times daily as needed for muscle spasms. 03/28/14  Yes Sherren Mocha, MD  diltiazem (CARDIZEM CD) 180 MG 24 hr capsule Take 180 mg by mouth daily.   Yes Historical Provider, MD  fish oil-omega-3 fatty acids 1000 MG capsule Take 2 g by mouth 3 (three) times daily.     Yes Historical Provider, MD  furosemide (LASIX) 20 MG tablet Take 3 tablets (60 mg total) by mouth daily. 09/14/11  Yes Shade Flood, MD  HYDROcodone-acetaminophen (NORCO/VICODIN) 5-325 MG per tablet Take 1 tablet by mouth every 6 (six) hours as needed for moderate pain. 03/28/14  Yes Sherren Mocha, MD  potassium citrate (UROCIT-K) 10 MEQ (1080 MG) SR tablet TAKE 1 TABLET BY MOUTH EVERY DAY BEFORE BREAKFAST 07/13/13  Yes Shade Flood, MD  valsartan (DIOVAN) 80 MG tablet Take 1 tablet (80 mg total) by mouth daily. 09/14/11  Yes Shade Flood, MD  ciprofloxacin (CIPRO) 250 MG tablet Take 1 tablet (250 mg total) by mouth 2 (two) times daily. 03/19/14   Thao P Le, DO  clonazePAM (KLONOPIN) 0.5 MG tablet Take 1 tablet (0.5 mg total) by mouth 2 (two) times daily as needed for anxiety. 07/13/13   Shade Flood, MD  rosuvastatin (CRESTOR) 10 MG tablet Take 10 mg by mouth daily.    Historical Provider, MD   History   Social History  . Marital Status: Divorced    Spouse Name: N/A    Number of Children: N/A  . Years of Education: N/A   Occupational History  . Not on file.   Social History Main Topics  . Smoking status: Never Smoker   . Smokeless tobacco: Not on file  . Alcohol Use: 0.0 oz/week     Comment: rare occasion  . Drug Use: No  . Sexual Activity: Not on file   Other Topics Concern  . Not on file   Social History Narrative  . No narrative on file    Review of Systems  Constitutional: Negative for fever, fatigue and unexpected weight change.       No night sweats, no wt loss.   Respiratory: Positive for cough. Negative for chest  tightness and shortness of breath.        No hemoptysis.   Cardiovascular: Negative for chest pain, palpitations and leg swelling.  Gastrointestinal: Negative for abdominal pain and blood in stool.  Genitourinary: Negative  for urgency, hematuria (no recent blood seen - see last ov. ) and difficulty urinating.  Musculoskeletal: Positive for arthralgias (r upper arm. ).  Skin: Negative for rash.  Neurological: Negative for dizziness, syncope, light-headedness and headaches.       Objective:   Physical Exam  Vitals reviewed. Constitutional: She is oriented to person, place, and time. She appears well-developed and well-nourished.  HENT:  Head: Normocephalic and atraumatic.  Eyes: Conjunctivae and EOM are normal. Pupils are equal, round, and reactive to light.  Neck: Carotid bruit is not present.  Cardiovascular: Normal rate, regular rhythm, normal heart sounds and intact distal pulses.   Pulmonary/Chest: Effort normal and breath sounds normal.  Abdominal: Soft. She exhibits no pulsatile midline mass. There is no tenderness.  Musculoskeletal:       Cervical back: She exhibits tenderness (min discomfort with R rotation and lateral flexion. nontender on palpation. ). She exhibits normal range of motion.       Arms: Neurological: She is alert and oriented to person, place, and time. She has normal strength. No sensory deficit.  Reflex Scores:      Tricep reflexes are 2+ on the right side and 2+ on the left side.      Bicep reflexes are 2+ on the right side and 2+ on the left side.      Brachioradialis reflexes are 2+ on the right side and 2+ on the left side. Skin: Skin is warm and dry.  Psychiatric: She has a normal mood and affect. Her behavior is normal.   Filed Vitals:   03/29/14 1419  BP: 120/80  Pulse: 74  Temp: 98.7 F (37.1 C)  TempSrc: Oral  Resp: 16  Height: $Remove'5\' 4"'rzzhYgL$  (1.626 m)  Weight: 208 lb 9.6 oz (94.62 kg)  SpO2: 97%      Assessment & Plan:   April Banks is a 58  y.o. female History of HSV-2 infection - Plan: acyclovir (ZOVIRAX) 200 MG capsule refilled for daily suppressive use.   Other secondary hypertension - Plan: diltiazem (CARDIZEM CD) 180 MG 24 hr capsule, furosemide (LASIX) 20 MG tablet, Diovan $RemoveBefore'80mg'kjvDYfXPyyMVw$  qd,  potassium citrate (UROCIT-K) 10 MEQ (1080 MG) SR tablet  -stable on current regimen.   IgA nephropathy - Plan: potassium citrate (UROCIT-K) 10 MEQ (1080 MG) SR tablet  -slight recent inc in creat, but returned to baseline. Cont same regimen and follow up with Dr. Joseph Berkshire.   Pain in joint, upper arm, right, Neck pain on right side  - cervical source possible or brachial plexopathy, but negative tinel's over plexus. Improved for now. Ok to cont flexeril 1/2-1 TID prn - SED.  rtc if flairs again.  Neck care manual given.   COUGH - improving, suspected LRTI, after initial URI sx's - suspect viral cause even with prior foreign travel. Cont sx care and rtc precautions.   Meds ordered this encounter  Medications  . acyclovir (ZOVIRAX) 200 MG capsule    Sig: TAKE ONE CAPSULE BY MOUTH EVERY DAY    Dispense:  90 capsule    Refill:  3  . diltiazem (CARDIZEM CD) 180 MG 24 hr capsule    Sig: Take 1 capsule (180 mg total) by mouth daily.    Dispense:  90 capsule    Refill:  1  . furosemide (LASIX) 20 MG tablet    Sig: Take 3 tablets (60 mg total) by mouth daily.    Dispense:  270 tablet    Refill:  1  . potassium  citrate (UROCIT-K) 10 MEQ (1080 MG) SR tablet    Sig: TAKE 1 TABLET BY MOUTH EVERY DAY BEFORE BREAKFAST    Dispense:  90 tablet    Refill:  1  . valsartan (DIOVAN) 80 MG tablet    Sig: Take 1 tablet (80 mg total) by mouth daily.    Dispense:  90 tablet    Refill:  1   Patient Instructions  Return for recheck cough if not improved in next 2 weeks - if fever, night sweats, or any blood in cough - return right away.  1/2 to 1 of the flexeril as needed. See the neck care manual.  If arm and neck not improving in next 2 weeks - recheck.  Return to the clinic or go to the nearest emergency room if any of your symptoms worsen or new symptoms occur.  Fasting labs with Dr. Joseph Berkshire as scheduled. Ok to restart crestor as arm pain resolves.  Plan on physical in next 3 months to review other health maintenance items.

## 2014-04-25 ENCOUNTER — Ambulatory Visit (INDEPENDENT_AMBULATORY_CARE_PROVIDER_SITE_OTHER): Payer: BC Managed Care – PPO | Admitting: Family Medicine

## 2014-04-25 VITALS — BP 152/94 | HR 98 | Temp 98.0°F | Resp 19 | Ht 64.0 in | Wt 203.4 lb

## 2014-04-25 DIAGNOSIS — R059 Cough, unspecified: Secondary | ICD-10-CM

## 2014-04-25 DIAGNOSIS — N02B9 Other recurrent and persistent immunoglobulin A nephropathy: Secondary | ICD-10-CM

## 2014-04-25 DIAGNOSIS — D72829 Elevated white blood cell count, unspecified: Secondary | ICD-10-CM

## 2014-04-25 DIAGNOSIS — N028 Recurrent and persistent hematuria with other morphologic changes: Secondary | ICD-10-CM

## 2014-04-25 DIAGNOSIS — N059 Unspecified nephritic syndrome with unspecified morphologic changes: Secondary | ICD-10-CM

## 2014-04-25 DIAGNOSIS — R319 Hematuria, unspecified: Secondary | ICD-10-CM

## 2014-04-25 DIAGNOSIS — R5081 Fever presenting with conditions classified elsewhere: Secondary | ICD-10-CM

## 2014-04-25 DIAGNOSIS — R05 Cough: Secondary | ICD-10-CM

## 2014-04-25 DIAGNOSIS — J029 Acute pharyngitis, unspecified: Secondary | ICD-10-CM

## 2014-04-25 LAB — BASIC METABOLIC PANEL WITH GFR
BUN: 36 mg/dL — AB (ref 6–23)
CALCIUM: 9.6 mg/dL (ref 8.4–10.5)
CO2: 27 mEq/L (ref 19–32)
Chloride: 101 mEq/L (ref 96–112)
Creat: 1.91 mg/dL — ABNORMAL HIGH (ref 0.50–1.10)
GFR, Est African American: 33 mL/min — ABNORMAL LOW
GFR, Est Non African American: 29 mL/min — ABNORMAL LOW
GLUCOSE: 119 mg/dL — AB (ref 70–99)
POTASSIUM: 4.7 meq/L (ref 3.5–5.3)
SODIUM: 139 meq/L (ref 135–145)

## 2014-04-25 LAB — POCT UA - MICROSCOPIC ONLY
AMORPHOUS: POSITIVE
CASTS, UR, LPF, POC: NEGATIVE
Crystals, Ur, HPF, POC: NEGATIVE
MUCUS UA: NEGATIVE
Yeast, UA: NEGATIVE

## 2014-04-25 LAB — POCT CBC
GRANULOCYTE PERCENT: 80.7 % — AB (ref 37–80)
HEMATOCRIT: 40.8 % (ref 37.7–47.9)
Hemoglobin: 13.3 g/dL (ref 12.2–16.2)
Lymph, poc: 1.8 (ref 0.6–3.4)
MCH, POC: 29 pg (ref 27–31.2)
MCHC: 32.6 g/dL (ref 31.8–35.4)
MCV: 89 fL (ref 80–97)
MID (CBC): 0.7 (ref 0–0.9)
MPV: 8.9 fL (ref 0–99.8)
POC GRANULOCYTE: 10.3 — AB (ref 2–6.9)
POC LYMPH PERCENT: 14 %L (ref 10–50)
POC MID %: 5.3 %M (ref 0–12)
Platelet Count, POC: 266 10*3/uL (ref 142–424)
RBC: 4.58 M/uL (ref 4.04–5.48)
RDW, POC: 15.6 %
WBC: 12.8 10*3/uL — AB (ref 4.6–10.2)

## 2014-04-25 LAB — POCT URINALYSIS DIPSTICK
Bilirubin, UA: NEGATIVE
Glucose, UA: NEGATIVE
Ketones, UA: NEGATIVE
NITRITE UA: NEGATIVE
PH UA: 5.5
SPEC GRAV UA: 1.02
UROBILINOGEN UA: 0.2

## 2014-04-25 LAB — POCT RAPID STREP A (OFFICE): Rapid Strep A Screen: NEGATIVE

## 2014-04-25 MED ORDER — AZITHROMYCIN 250 MG PO TABS: ORAL_TABLET | ORAL | Status: DC

## 2014-04-25 NOTE — Progress Notes (Addendum)
Subjective:    Patient ID: April Banks, female    DOB: 1955-10-12, 58 y.o.   MRN: [id number] This chart was scribed for Wendie Agreste, MD by Martinique Peace, ED Scribe. The patient was seen in [facility]. The patient's care was started at 8:51 AM.  HPI HPI Comments: April Banks is a 58 y.o. female who presents to the [facility] complaining of cough and sore throat onset Wednesday with associated max fever of 101 and chills. She reports kidney disease and noticed hematuria with some moderate back pain. Pt states noticing white spots on sore throat. She denies any fever since yesterday. Pt reports possible indirect contact with someone who was sick.   Nephropathy, noticed gross amount of blood in urine on Thursday that she can still see currently. Amount was somewhat decreased. BP around 160's/90's. Pt states that she is scheduled to see Dr. Joseph Berkshire at the end of this month for her 6 month follow up. Weight has dropped from 208 to 203 since last visit. She denies any urinary problems, nausea or vomiting.   History of IgA Nephropathy. Followed by Dr. Joseph Berkshire at [facility]. Most recent creatinine was 1.64, 1 month ago with a range of 1.2-1.7. Here with acute illness with possible flare of Nephropathy.  Patient Active Problem List   Diagnosis Date Noted  . Fat necrosis (segmental) of breast 02/15/2012  . HTN (hypertension) 08/25/2011  . MVP (mitral valve prolapse) 08/25/2011  . Herpes genitalis in women 08/25/2011  . Other and unspecified hyperlipidemia 09/04/2010  . OBESITY, UNSPECIFIED 09/04/2010  . ESSENTIAL HYPERTENSION, BENIGN 09/04/2010  . IGA NEPHROPATHY 07/31/2010   Past Medical History  Diagnosis Date  . Renal abscess   . Basal cell carcinoma of leg   . Nephrolithiasis   . Anemia   . Heart murmur   . Hyperlipidemia   . Hypertension    Past Surgical History  Procedure Laterality Date  . Abdominal hysterectomy    . Arthroplasty w/ arthroscopy medial / lateral compartment knee    .  Renal biopsy  2010  . Breast enhancement surgery  1990   Allergies  Allergen Reactions  . Gantrisin [Sulfisoxazole] Shortness Of Breath and Nausea Only  . Sulfa Antibiotics Shortness Of Breath and Rash  . Ceclor [Cefaclor] Rash  . Macrodantin Nausea And Vomiting  . Pyridium [Phenazopyridine Hcl]   . Pyridium [Phenazopyridine Hcl] Nausea Only   Prior to Admission medications   Medication Sig Start Date End Date Taking? Authorizing Provider  acetaminophen (TYLENOL) 500 MG tablet Take 500 mg by mouth every 6 (six) hours as needed.   Yes Historical Provider, MD  acyclovir (ZOVIRAX) 200 MG capsule TAKE ONE CAPSULE BY MOUTH EVERY DAY 03/29/14  Yes Wendie Agreste, MD  aspirin 81 MG tablet Take 81 mg by mouth daily.     Yes Historical Provider, MD  diltiazem (CARDIZEM CD) 180 MG 24 hr capsule Take 1 capsule (180 mg total) by mouth daily. 03/29/14  Yes Wendie Agreste, MD  fish oil-omega-3 fatty acids 1000 MG capsule Take 2 g by mouth 3 (three) times daily.     Yes Historical Provider, MD  furosemide (LASIX) 20 MG tablet Take 3 tablets (60 mg total) by mouth daily. 03/29/14  Yes Wendie Agreste, MD  potassium citrate (UROCIT-K) 10 MEQ (1080 MG) SR tablet TAKE 1 TABLET BY MOUTH EVERY DAY BEFORE BREAKFAST 03/29/14  Yes Wendie Agreste, MD  rosuvastatin (CRESTOR) 10 MG tablet Take 10 mg by mouth daily.  Yes Historical Provider, MD  valsartan (DIOVAN) 80 MG tablet Take 1 tablet (80 mg total) by mouth daily. 03/29/14  Yes Wendie Agreste, MD  clonazePAM (KLONOPIN) 0.5 MG tablet Take 1 tablet (0.5 mg total) by mouth 2 (two) times daily as needed for anxiety. 07/13/13   Wendie Agreste, MD  cyclobenzaprine (FLEXERIL) 10 MG tablet Take 1 tablet (10 mg total) by mouth 3 (three) times daily as needed for muscle spasms. 03/28/14   Shawnee Knapp, MD  HYDROcodone-acetaminophen (NORCO/VICODIN) 5-325 MG per tablet Take 1 tablet by mouth every 6 (six) hours as needed for moderate pain. 03/28/14   Shawnee Knapp, MD    History   Social History  . Marital Status: Divorced    Spouse Name: N/A    Number of Children: N/A  . Years of Education: N/A   Occupational History  . Not on file.   Social History Main Topics  . Smoking status: Never Smoker   . Smokeless tobacco: Never Used  . Alcohol Use: 0.0 oz/week     Comment: rare occasion  . Drug Use: No  . Sexual Activity: Not on file   Other Topics Concern  . Not on file   Social History Narrative  . No narrative on file      Review of Systems  Constitutional: Positive for fever (max: 101) and chills.  HENT: Positive for sore throat.   Respiratory: Positive for cough.   Gastrointestinal: Negative for nausea and vomiting.  Genitourinary: Positive for hematuria. Negative for dysuria, urgency, frequency and decreased urine volume.  Musculoskeletal: Positive for back pain.       Objective:   Physical Exam  Vitals reviewed. Constitutional: She is oriented to person, place, and time. She appears well-developed and well-nourished. No distress.  HENT:  Head: Normocephalic and atraumatic.  Right Ear: Hearing, tympanic membrane, external ear and ear canal normal.  Left Ear: Hearing, tympanic membrane, external ear and ear canal normal.  Nose: Nose normal.  Mouth/Throat: Oropharynx is clear and moist. No oropharyngeal exudate.  Erythema of posterior oropharynx with scattered white spots on  of bilateral tonsils.   Eyes: Conjunctivae and EOM are normal. Pupils are equal, round, and reactive to light.  Neck: Normal range of motion. Neck supple.  Cardiovascular: Normal rate, regular rhythm, normal heart sounds and intact distal pulses.   No murmur heard. Pulmonary/Chest: Effort normal and breath sounds normal. No respiratory distress. She has no wheezes. She has no rhonchi.  Abdominal: Soft. Bowel sounds are normal. She exhibits no distension.  Musculoskeletal: Normal range of motion.  Lymphadenopathy:    She has no cervical adenopathy.   Neurological: She is alert and oriented to person, place, and time.  Skin: Skin is warm and dry. No rash noted.  Psychiatric: She has a normal mood and affect. Her behavior is normal.   Filed Vitals:   04/25/14 0815  BP: 152/94  Pulse: 98  Temp: 98 F (36.7 C)  TempSrc: Oral  Resp: 19  Height: $Remove'5\' 4"'EvVOnFc$  (1.626 m)  Weight: 203 lb 6.4 oz (92.262 kg)  SpO2: 97%   Results for orders placed in visit on 04/25/14  POCT RAPID STREP A (OFFICE)      Result Value Ref Range   Rapid Strep A Screen Negative  Negative  POCT CBC      Result Value Ref Range   WBC 12.8 (*) 4.6 - 10.2 K/uL   Lymph, poc 1.8  0.6 - 3.4   POC LYMPH PERCENT  14.0  10 - 50 %L   MID (cbc) 0.7  0 - 0.9   POC MID % 5.3  0 - 12 %M   POC Granulocyte 10.3 (*) 2 - 6.9   Granulocyte percent 80.7 (*) 37 - 80 %G   RBC 4.58  4.04 - 5.48 M/uL   Hemoglobin 13.3  12.2 - 16.2 g/dL   HCT, POC 40.8  37.7 - 47.9 %   MCV 89.0  80 - 97 fL   MCH, POC 29.0  27 - 31.2 pg   MCHC 32.6  31.8 - 35.4 g/dL   RDW, POC 15.6     Platelet Count, POC 266  142 - 424 K/uL   MPV 8.9  0 - 99.8 fL  POCT UA - MICROSCOPIC ONLY      Result Value Ref Range   WBC, Ur, HPF, POC 5-7     RBC, urine, microscopic 10-15     Bacteria, U Microscopic trace     Mucus, UA neg     Epithelial cells, urine per micros 1-3     Crystals, Ur, HPF, POC neg     Casts, Ur, LPF, POC neg     Yeast, UA neg     Amorphous pos    POCT URINALYSIS DIPSTICK      Result Value Ref Range   Color, UA yellow     Clarity, UA slightly cloudy     Glucose, UA neg     Bilirubin, UA neg     Ketones, UA neg     Spec Grav, UA 1.020     Blood, UA large     pH, UA 5.5     Protein, UA >=300     Urobilinogen, UA 0.2     Nitrite, UA neg     Leukocytes, UA Trace         Assessment & Plan:   April Banks is a 58 y.o. female Fever presenting with conditions classified elsewhere - Plan: POCT rapid strep A, POCT CBC, BASIC METABOLIC PANEL WITH GFR, Culture, Group A Strep, azithromycin  (ZITHROMAX) 250 MG tablet, Acute pharyngitis, unspecified pharyngitis type - Plan: POCT rapid strep A, POCT CBC, BASIC METABOLIC PANEL WITH GFR, Culture, Group A Strep, azithromycin (ZITHROMAX) 250 MG tablet, Cough - Plan: POCT rapid strep A, POCT CBC, BASIC METABOLIC PANEL WITH GFR, Culture, Group A Strep, azithromycin (ZITHROMAX) 250 MG tablet, Leukocytosis, unspecified - Plan: azithromycin (ZITHROMAX) 250 MG tablet  -exudative tonsilitis - possible false negative strep, with leukocytosis.   -start zpak  -sx care, and rtc precautions.   Hematuria -, IgA nephropathy - Plan: POCT CBC, BASIC METABOLIC PANEL WITH GFR, Culture, Group A Strep  -possible flair of IGA nephropathy with current illness, but ddx strep glomerulonephritis. Stat BMP today, then to coordinate care with nephrologist. Urine cx pending, but doubt infection without other sx's than hematuria which is lessening.   Addendum: Stat BMP: Results for orders placed in visit on 45/80/99  BASIC METABOLIC PANEL WITH GFR      Result Value Ref Range   Sodium 139  135 - 145 mEq/L   Potassium 4.7  3.5 - 5.3 mEq/L   Chloride 101  96 - 112 mEq/L   CO2 27  19 - 32 mEq/L   Glucose, Bld 119 (*) 70 - 99 mg/dL   BUN 36 (*) 6 - 23 mg/dL   Creat 1.91 (*) 0.50 - 1.10 mg/dL   Calcium 9.6  8.4 - 10.5 mg/dL  GFR, Est African American 33 (*)    GFR, Est Non African American 29 (*)   Slight increase in creatinine, but with decrease in hematuria today, will place lab only order for tomorrow for repeat BMP/GFR, then to decide next step.adivsed pt of results.    Meds ordered this encounter  Medications  . azithromycin (ZITHROMAX) 250 MG tablet    Sig: 2 tabs by mouth on day one, then 1 tab by mouth each day.    Dispense:  6 tablet    Refill:  0   Patient Instructions  Start Zpak, mucinex for cough if needed, other information below for sore throat. We will have kidney function back later today. Throat culture in 4-5 days.  Recheck in 2 days if  not improving, sooner if worse. Follow up with nephrologist as planned, but will have kidney test back today.  Return to the clinic or go to the nearest emergency room if any of your symptoms worsen or new symptoms occur.  Sore Throat A sore throat is pain, burning, irritation, or scratchiness of the throat. There is often pain or tenderness when swallowing or talking. A sore throat may be accompanied by other symptoms, such as coughing, sneezing, fever, and swollen neck glands. A sore throat is often the first sign of another sickness, such as a cold, flu, strep throat, or mononucleosis (commonly known as mono). Most sore throats go away without medical treatment. CAUSES  The most common causes of a sore throat include:  A viral infection, such as a cold, flu, or mono.  A bacterial infection, such as strep throat, tonsillitis, or whooping cough.  Seasonal allergies.  Dryness in the air.  Irritants, such as smoke or pollution.  Gastroesophageal reflux disease (GERD). HOME CARE INSTRUCTIONS   Only take over-the-counter medicines as directed by your caregiver.  Drink enough fluids to keep your urine clear or pale yellow.  Rest as needed.  Try using throat sprays, lozenges, or sucking on hard candy to ease any pain (if older than 4 years or as directed).  Sip warm liquids, such as broth, herbal tea, or warm water with honey to relieve pain temporarily. You may also eat or drink cold or frozen liquids such as frozen ice pops.  Gargle with salt water (mix 1 tsp salt with 8 oz of water).  Do not smoke and avoid secondhand smoke.  Put a cool-mist humidifier in your bedroom at night to moisten the air. You can also turn on a hot shower and sit in the bathroom with the door closed for 5-10 minutes. SEEK IMMEDIATE MEDICAL CARE IF:  You have difficulty breathing.  You are unable to swallow fluids, soft foods, or your saliva.  You have increased swelling in the throat.  Your sore  throat does not get better in 7 days.  You have nausea and vomiting.  You have a fever or persistent symptoms for more than 2-3 days.  You have a fever and your symptoms suddenly get worse. MAKE SURE YOU:   Understand these instructions.  Will watch your condition.  Will get help right away if you are not doing well or get worse. Document Released: 09/06/2004 Document Revised: 07/16/2012 Document Reviewed: 04/06/2012 [facility] Patient Information 2015 [place], [place]. This information is not intended to replace advice given to you by your health care provider. Make sure you discuss any questions you have with your health care provider.    I personally performed the services described in this documentation, which was  scribed in my presence. The recorded information has been reviewed and considered, and addended by me as needed.

## 2014-04-25 NOTE — Patient Instructions (Addendum)
Start Zpak, mucinex for cough if needed, other information below for sore throat. We will have kidney function back later today. Throat culture in 4-5 days. Also sending out urine culture, but less likely infected.  Recheck in 2 days if not improving, sooner if worse. Follow up with nephrologist as planned, but will have kidney test back today.  Return to the clinic or go to the nearest emergency room if any of your symptoms worsen or new symptoms occur.  Sore Throat A sore throat is pain, burning, irritation, or scratchiness of the throat. There is often pain or tenderness when swallowing or talking. A sore throat may be accompanied by other symptoms, such as coughing, sneezing, fever, and swollen neck glands. A sore throat is often the first sign of another sickness, such as a cold, flu, strep throat, or mononucleosis (commonly known as mono). Most sore throats go away without medical treatment. CAUSES  The most common causes of a sore throat include:  A viral infection, such as a cold, flu, or mono.  A bacterial infection, such as strep throat, tonsillitis, or whooping cough.  Seasonal allergies.  Dryness in the air.  Irritants, such as smoke or pollution.  Gastroesophageal reflux disease (GERD). HOME CARE INSTRUCTIONS   Only take over-the-counter medicines as directed by your caregiver.  Drink enough fluids to keep your urine clear or pale yellow.  Rest as needed.  Try using throat sprays, lozenges, or sucking on hard candy to ease any pain (if older than 4 years or as directed).  Sip warm liquids, such as broth, herbal tea, or warm water with honey to relieve pain temporarily. You may also eat or drink cold or frozen liquids such as frozen ice pops.  Gargle with salt water (mix 1 tsp salt with 8 oz of water).  Do not smoke and avoid secondhand smoke.  Put a cool-mist humidifier in your bedroom at night to moisten the air. You can also turn on a hot shower and sit in the  bathroom with the door closed for 5-10 minutes. SEEK IMMEDIATE MEDICAL CARE IF:  You have difficulty breathing.  You are unable to swallow fluids, soft foods, or your saliva.  You have increased swelling in the throat.  Your sore throat does not get better in 7 days.  You have nausea and vomiting.  You have a fever or persistent symptoms for more than 2-3 days.  You have a fever and your symptoms suddenly get worse. MAKE SURE YOU:   Understand these instructions.  Will watch your condition.  Will get help right away if you are not doing well or get worse. Document Released: 09/06/2004 Document Revised: 07/16/2012 Document Reviewed: 04/06/2012 [facility] Patient Information 2015 [place], [place]. This information is not intended to replace advice given to you by your health care provider. Make sure you discuss any questions you have with your health care provider.

## 2014-04-26 ENCOUNTER — Other Ambulatory Visit: Payer: BC Managed Care – PPO | Admitting: *Deleted

## 2014-04-26 DIAGNOSIS — R319 Hematuria, unspecified: Secondary | ICD-10-CM

## 2014-04-26 DIAGNOSIS — N028 Recurrent and persistent hematuria with other morphologic changes: Secondary | ICD-10-CM

## 2014-04-26 LAB — BASIC METABOLIC PANEL WITH GFR
BUN: 34 mg/dL — ABNORMAL HIGH (ref 6–23)
CO2: 27 mEq/L (ref 19–32)
Calcium: 9.2 mg/dL (ref 8.4–10.5)
Chloride: 101 mEq/L (ref 96–112)
Creat: 1.96 mg/dL — ABNORMAL HIGH (ref 0.50–1.10)
GFR, EST NON AFRICAN AMERICAN: 28 mL/min — AB
GFR, Est African American: 32 mL/min — ABNORMAL LOW
GLUCOSE: 135 mg/dL — AB (ref 70–99)
POTASSIUM: 4 meq/L (ref 3.5–5.3)
SODIUM: 141 meq/L (ref 135–145)

## 2014-04-26 LAB — URINE CULTURE

## 2014-04-26 NOTE — Progress Notes (Signed)
Patient here for labs only. 

## 2014-04-27 ENCOUNTER — Telehealth: Payer: Self-pay | Admitting: *Deleted

## 2014-04-27 DIAGNOSIS — R7989 Other specified abnormal findings of blood chemistry: Secondary | ICD-10-CM

## 2014-04-27 NOTE — Telephone Encounter (Signed)
Call pt - creat 1.91 to 1.96 - minimal change, but slightly up.  Can recheck again tomorrow morning, then if still increasing, I can call her nephrologist. How is she feeling?

## 2014-04-27 NOTE — Telephone Encounter (Signed)
Pt called in regards to recent lab work. Doesn't look like it has been reviewed yet. Please advise .

## 2014-04-27 NOTE — Telephone Encounter (Signed)
Pt is not seeing any visible blood in her urine. Her BP has decreased. She reports she is improving. She will be by the office tomorrow morning to LABS ONLY>

## 2014-04-27 NOTE — Telephone Encounter (Signed)
Additionally - urine culture without apparent infection and throat culture negative so far. continuing to watch this though.

## 2014-04-28 ENCOUNTER — Other Ambulatory Visit (INDEPENDENT_AMBULATORY_CARE_PROVIDER_SITE_OTHER): Payer: BC Managed Care – PPO | Admitting: *Deleted

## 2014-04-28 DIAGNOSIS — R799 Abnormal finding of blood chemistry, unspecified: Secondary | ICD-10-CM

## 2014-04-28 DIAGNOSIS — R7989 Other specified abnormal findings of blood chemistry: Secondary | ICD-10-CM

## 2014-04-28 LAB — BASIC METABOLIC PANEL WITH GFR
BUN: 34 mg/dL — AB (ref 6–23)
CHLORIDE: 102 meq/L (ref 96–112)
CO2: 25 mEq/L (ref 19–32)
Calcium: 8.9 mg/dL (ref 8.4–10.5)
Creat: 1.75 mg/dL — ABNORMAL HIGH (ref 0.50–1.10)
GFR, Est African American: 37 mL/min — ABNORMAL LOW
GFR, Est Non African American: 32 mL/min — ABNORMAL LOW
Glucose, Bld: 166 mg/dL — ABNORMAL HIGH (ref 70–99)
POTASSIUM: 3.9 meq/L (ref 3.5–5.3)
Sodium: 138 mEq/L (ref 135–145)

## 2014-04-28 LAB — CULTURE, GROUP A STREP: Organism ID, Bacteria: NORMAL

## 2014-04-28 NOTE — Progress Notes (Signed)
Pt here for lab draw only  

## 2014-04-29 ENCOUNTER — Other Ambulatory Visit: Payer: Self-pay | Admitting: Family Medicine

## 2014-04-29 DIAGNOSIS — R7989 Other specified abnormal findings of blood chemistry: Secondary | ICD-10-CM

## 2014-04-29 DIAGNOSIS — N028 Recurrent and persistent hematuria with other morphologic changes: Secondary | ICD-10-CM

## 2014-05-02 ENCOUNTER — Other Ambulatory Visit (INDEPENDENT_AMBULATORY_CARE_PROVIDER_SITE_OTHER): Payer: BC Managed Care – PPO | Admitting: *Deleted

## 2014-05-02 DIAGNOSIS — R319 Hematuria, unspecified: Secondary | ICD-10-CM

## 2014-05-02 DIAGNOSIS — N028 Recurrent and persistent hematuria with other morphologic changes: Secondary | ICD-10-CM

## 2014-05-02 DIAGNOSIS — R7989 Other specified abnormal findings of blood chemistry: Secondary | ICD-10-CM

## 2014-05-02 LAB — BASIC METABOLIC PANEL WITH GFR
BUN: 34 mg/dL — ABNORMAL HIGH (ref 6–23)
CHLORIDE: 103 meq/L (ref 96–112)
CO2: 30 meq/L (ref 19–32)
Calcium: 9.3 mg/dL (ref 8.4–10.5)
Creat: 1.79 mg/dL — ABNORMAL HIGH (ref 0.50–1.10)
GFR, Est African American: 36 mL/min — ABNORMAL LOW
GFR, Est Non African American: 31 mL/min — ABNORMAL LOW
Glucose, Bld: 152 mg/dL — ABNORMAL HIGH (ref 70–99)
Potassium: 4.3 mEq/L (ref 3.5–5.3)
SODIUM: 141 meq/L (ref 135–145)

## 2014-07-05 ENCOUNTER — Other Ambulatory Visit: Payer: Self-pay

## 2014-07-05 ENCOUNTER — Ambulatory Visit (INDEPENDENT_AMBULATORY_CARE_PROVIDER_SITE_OTHER): Payer: BC Managed Care – PPO | Admitting: Family Medicine

## 2014-07-05 ENCOUNTER — Encounter: Payer: Self-pay | Admitting: Family Medicine

## 2014-07-05 VITALS — BP 138/82 | HR 63 | Temp 98.0°F | Resp 16 | Ht 64.0 in | Wt 206.8 lb

## 2014-07-05 DIAGNOSIS — Z1211 Encounter for screening for malignant neoplasm of colon: Secondary | ICD-10-CM

## 2014-07-05 DIAGNOSIS — Z23 Encounter for immunization: Secondary | ICD-10-CM

## 2014-07-05 DIAGNOSIS — Z13220 Encounter for screening for lipoid disorders: Secondary | ICD-10-CM

## 2014-07-05 DIAGNOSIS — Z1329 Encounter for screening for other suspected endocrine disorder: Secondary | ICD-10-CM

## 2014-07-05 DIAGNOSIS — I10 Essential (primary) hypertension: Secondary | ICD-10-CM

## 2014-07-05 DIAGNOSIS — Z1231 Encounter for screening mammogram for malignant neoplasm of breast: Secondary | ICD-10-CM

## 2014-07-05 DIAGNOSIS — R319 Hematuria, unspecified: Secondary | ICD-10-CM

## 2014-07-05 DIAGNOSIS — Z0000 Encounter for general adult medical examination without abnormal findings: Secondary | ICD-10-CM

## 2014-07-05 DIAGNOSIS — Z124 Encounter for screening for malignant neoplasm of cervix: Secondary | ICD-10-CM

## 2014-07-05 LAB — POCT UA - MICROSCOPIC ONLY
Casts, Ur, LPF, POC: NEGATIVE
Crystals, Ur, HPF, POC: NEGATIVE
Mucus, UA: NEGATIVE
Yeast, UA: NEGATIVE

## 2014-07-05 LAB — POCT URINALYSIS DIPSTICK
BILIRUBIN UA: NEGATIVE
Glucose, UA: NEGATIVE
KETONES UA: NEGATIVE
Nitrite, UA: NEGATIVE
PH UA: 6
Protein, UA: 100
Spec Grav, UA: <=1.005
Urobilinogen, UA: 0.2

## 2014-07-05 LAB — CBC WITH DIFFERENTIAL/PLATELET
BASOS ABS: 0.1 10*3/uL (ref 0.0–0.1)
BASOS PCT: 1 % (ref 0–1)
Eosinophils Absolute: 0.3 10*3/uL (ref 0.0–0.7)
Eosinophils Relative: 4 % (ref 0–5)
HEMATOCRIT: 36.6 % (ref 36.0–46.0)
Hemoglobin: 12.4 g/dL (ref 12.0–15.0)
Lymphocytes Relative: 26 % (ref 12–46)
Lymphs Abs: 1.7 10*3/uL (ref 0.7–4.0)
MCH: 29 pg (ref 26.0–34.0)
MCHC: 33.9 g/dL (ref 30.0–36.0)
MCV: 85.7 fL (ref 78.0–100.0)
MPV: 11.1 fL (ref 9.4–12.4)
Monocytes Absolute: 0.5 10*3/uL (ref 0.1–1.0)
Monocytes Relative: 7 % (ref 3–12)
NEUTROS ABS: 4.1 10*3/uL (ref 1.7–7.7)
NEUTROS PCT: 62 % (ref 43–77)
Platelets: 294 10*3/uL (ref 150–400)
RBC: 4.27 MIL/uL (ref 3.87–5.11)
RDW: 15.2 % (ref 11.5–15.5)
WBC: 6.6 10*3/uL (ref 4.0–10.5)

## 2014-07-05 LAB — COMPREHENSIVE METABOLIC PANEL
ALBUMIN: 4.3 g/dL (ref 3.5–5.2)
ALT: 13 U/L (ref 0–35)
AST: 13 U/L (ref 0–37)
Alkaline Phosphatase: 86 U/L (ref 39–117)
BUN: 36 mg/dL — ABNORMAL HIGH (ref 6–23)
CALCIUM: 9.7 mg/dL (ref 8.4–10.5)
CO2: 28 meq/L (ref 19–32)
CREATININE: 1.58 mg/dL — AB (ref 0.50–1.10)
Chloride: 105 mEq/L (ref 96–112)
Glucose, Bld: 92 mg/dL (ref 70–99)
Potassium: 4.6 mEq/L (ref 3.5–5.3)
Sodium: 144 mEq/L (ref 135–145)
Total Bilirubin: 0.3 mg/dL (ref 0.2–1.2)
Total Protein: 6.3 g/dL (ref 6.0–8.3)

## 2014-07-05 LAB — LIPID PANEL
CHOL/HDL RATIO: 3 ratio
CHOLESTEROL: 205 mg/dL — AB (ref 0–200)
HDL: 68 mg/dL (ref >39)
LDL Cholesterol: 117 mg/dL — ABNORMAL HIGH (ref 0–99)
Triglycerides: 102 mg/dL (ref <150)
VLDL: 20 mg/dL (ref 0–40)

## 2014-07-05 LAB — TSH: TSH: 0.666 u[IU]/mL (ref 0.350–4.500)

## 2014-07-05 NOTE — Progress Notes (Signed)
IDENTIFYING INFORMATION  April Banks / DOB: Feb 28, 1956 / MRN: [id number]  The patient has Other and unspecified hyperlipidemia; OBESITY, UNSPECIFIED; ESSENTIAL HYPERTENSION, BENIGN; IGA NEPHROPATHY; HTN (hypertension); MVP (mitral valve prolapse); and Herpes genitalis in women on her problem list.  SUBJECTIVE  Chief Complaint: Annual Exam   History of present illness: April Banks is a 58 y.o. year old female who presents for an annual exam.   She can not remember the exact time she received a pap smear.  She is amenable to receiving this today.  She has never had a positive pap smear. She declines vaginal dryness, itching and discharge at this time.    She does self breast examination every couple of months.  She reports having on normal mammogram as of December of last year, and is due to follow up with the breast center this December, an appointment which she plans to keep.    She denies smoking, and estimates 1.5 hours of moderate - vigourous intensity exercise 5 days weekly.  She reports some mild presyncope and nausea with intense exercise only, and reports that this only happens when she is working with her trainer, or if she is physically active at altitude for a business trip. She denies chest pain and pressure in these instances.  She denies SOB, DOE, chest pain, palpitations at rest and when exercising moderately.    She declines colon cancer screening at this time.  Her maternal grandmother has a history of colon cancer, from which she passed.  She is concerned about receiving a colonoscopy because her mother suffered from a perforated bowel.  However, she is strongly considering a colon screening at age 44.  She is amenable to hemoccult studies today.   She reports some new family stressors. She denies dysthymic mood and anhedonia.  She does not need any refills today.  She declines the need for HIV and syphilis screening today.  She has already received the annual flu  vaccine.    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    She has a current medication list which includes the following prescription(s): acetaminophen, acyclovir, aspirin, clonazepam, diltiazem, fish oil-omega-3 fatty acids, furosemide, potassium citrate, rosuvastatin, and valsartan.  April Banks is allergic to gantrisin; sulfa antibiotics; ceclor; macrodantin; lisinopril; pyridium; and pyridium. She  reports that she has never smoked. She has never used smokeless tobacco. She reports that she drinks alcohol. She reports that she does not use illicit drugs. She  has no sexual activity history on file.  The patient  has past surgical history that includes Abdominal hysterectomy; Arthroplasty w/ arthroscopy medial / lateral compartment knee; Renal biopsy (2010); Breast enhancement surgery (1990); Breast surgery; Eye surgery; and Tubal ligation.  Her family history includes Breast cancer in her maternal aunt; Cancer in her father; Colon cancer in her maternal grandmother; Heart disease in her father; Hyperlipidemia in her father and mother; Hypertension in her father; Skin cancer in her father.  Review of Systems  Constitutional: Negative.   HENT: Negative for congestion, ear discharge and nosebleeds.   Eyes: Negative.   Respiratory: Negative for cough and wheezing.   Cardiovascular: Negative for chest pain, palpitations, orthopnea, claudication, leg swelling and PND.  Gastrointestinal: Negative.   Genitourinary: Positive for hematuria. Negative for dysuria, urgency, frequency and flank pain.  Musculoskeletal: Negative.   Skin: Negative.   Neurological:  Negative for dizziness and headaches.  Psychiatric/Behavioral: Negative for depression.    OBJECTIVE  Blood pressure 138/82, pulse 63, temperature 98 F (36.7 C),  temperature source Oral, resp. rate 16, height $RemoveBe'5\' 4"'oRPITnBKE$  (1.626 m), weight 206 lb 12.8 oz (93.804 kg), SpO2 98 %. The patient's body mass index is 35.48 kg/(m^2).  Physical Exam  Constitutional: She is oriented to person, place, and time. She appears well-developed and well-nourished.  HENT:  Head: Normocephalic.  Right Ear: Hearing, tympanic membrane, external ear and ear canal normal.  Left Ear: Hearing, tympanic membrane, external ear and ear canal normal.  Nose: Nose normal.  Mouth/Throat: Uvula is midline, oropharynx is clear and moist and mucous membranes are normal.  Eyes: Conjunctivae and EOM are normal. Pupils are equal, round, and reactive to light.  Neck: Normal range of motion.  Cardiovascular: Normal rate and regular rhythm.   Respiratory: Effort normal and breath sounds normal.  GI: Soft. Bowel sounds are normal.  Musculoskeletal: Normal range of motion.  Lymphadenopathy:    She has no cervical adenopathy.  Neurological: She is alert and oriented to person, place, and time. She has normal reflexes.  Skin: Skin is warm and dry. No rash noted. No erythema. No pallor.  Psychiatric: She has a normal mood and affect. Her behavior is normal. Judgment and thought content normal.    No results found for this or any previous visit (from the past 24 hour(s)).  ASSESSMENT & PLAN  April Banks was seen today for annual exam.  Diagnoses and associated orders for this visit:  Annual physical exam  Hematuria - POCT UA - Microscopic Only - POCT urinalysis dipstick - Comprehensive metabolic panel - CBC with Differential  Screening cholesterol level - Lipid panel  Thyroid disorder screen - TSH  Colon cancer screening - IFOBT POC (occult bld, rslt in office)  Essential hypertension - Comprehensive metabolic panel - CBC with Differential - Lipid panel  Need for prophylactic vaccination against Streptococcus pneumoniae (pneumococcus) - Pneumococcal conjugate vaccine 13-valent  IM -     Pneumovax in 8 weeks.  Order is in.   Screening for Cervical Cancer -     Pap smear with reflex     The patient was instructed to to call or comeback to clinic as needed, or should symptoms warrant.  Philis Fendt, MHS, PA-C [facility] 07/06/2014 7:53 PM

## 2014-07-05 NOTE — Progress Notes (Signed)
   Subjective:    Patient ID: April Banks, female    DOB: 25-Apr-1956, 58 y.o.   MRN: [id number]  HPI    Review of Systems  Constitutional: Positive for fatigue.  HENT: Negative.   Eyes: Negative.   Respiratory: Negative.   Cardiovascular: Negative.   Gastrointestinal: Negative.   Endocrine: Negative.   Genitourinary: Positive for hematuria.  Musculoskeletal: Negative.   Skin: Negative.   Allergic/Immunologic: Negative.   Neurological: Positive for light-headedness.  Hematological: Negative.   Psychiatric/Behavioral: Negative.        Objective:   Physical Exam        Assessment & Plan:

## 2014-07-05 NOTE — Patient Instructions (Signed)
You should receive a call or letter about your lab results within the next week to 10 days.  Keeping You Healthy  Get These Tests  Blood Pressure- Have your blood pressure checked by your healthcare provider at least once a year.  Normal blood pressure is 120/80.  Weight- Have your body mass index (BMI) calculated to screen for obesity.  BMI is a measure of body fat based on height and weight.  You can calculate your own BMI at [employer].it  Cholesterol- Have your cholesterol checked every year.  Diabetes- Have your blood sugar checked every year if you have high blood pressure, high cholesterol, a family history of diabetes or if you are overweight.  Pap Smear- Have a pap smear every 1 to 3 years if you have been sexually active.  If you are older than 65 and recent pap smears have been normal you may not need additional pap smears.  In addition, if you have had a hysterectomy  For benign disease additional pap smears are not necessary.  Mammogram-Yearly mammograms are essential for early detection of breast cancer  Screening for Colon Cancer- Colonoscopy starting at age 70. Screening may begin sooner depending on your family history and other health conditions.  Follow up colonoscopy as directed by your Gastroenterologist.  Screening for Osteoporosis- Screening begins at age 29 with bone density scanning, sooner if you are at higher risk for developing Osteoporosis.  Get these medicines  Calcium with Vitamin D- Your body requires 1200-1500 mg of Calcium a day and [phone] IU of Vitamin D a day.  You can only absorb 500 mg of Calcium at a time therefore Calcium must be taken in 2 or 3 separate doses throughout the day.  Hormones- Hormone therapy has been associated with increased risk for certain cancers and heart disease.  Talk to your healthcare provider about if you need relief from menopausal symptoms.  Aspirin- Ask your healthcare provider about taking Aspirin to prevent  Heart Disease and Stroke.  Get these Immuniztions  Flu shot- Every fall  Pneumonia shot- Once after the age of 74; if you are younger ask your healthcare provider if you need a pneumonia shot.  Tetanus- Every ten years.  Zostavax- Once after the age of 21 to prevent shingles.  Take these steps  Don't smoke- Your healthcare provider can help you quit. For tips on how to quit, ask your healthcare provider or go to www.smokefree.gov or call 1-800 QUIT-NOW.  Be physically active- Exercise 5 days a week for a minimum of 30 minutes.  If you are not already physically active, start slow and gradually work up to 30 minutes of moderate physical activity.  Try walking, dancing, bike riding, swimming, etc.  Eat a healthy diet- Eat a variety of healthy foods such as fruits, vegetables, whole grains, low fat milk, low fat cheeses, yogurt, lean meats, chicken, fish, eggs, dried beans, tofu, etc.  For more information go to www.thenutritionsource.org  Dental visit- Brush and floss teeth twice daily; visit your dentist twice a year.  Eye exam- Visit your Optometrist or Ophthalmologist yearly.  Drink alcohol in moderation- Limit alcohol intake to one drink or less a day.  Never drink and drive.  Depression- Your emotional health is as important as your physical health.  If you're feeling down or losing interest in things you normally enjoy, please talk to your healthcare provider.  Seat Belts- can save your life; always wear one  Smoke/Carbon Monoxide detectors- These detectors need to be installed  on the appropriate level of your home.  Replace batteries at least once a year.  Violence- If anyone is threatening or hurting you, please tell your healthcare provider.  Living Will/ Health care power of attorney- Discuss with your healthcare provider and family.

## 2014-07-06 LAB — PAP IG W/ RFLX HPV ASCU

## 2014-07-06 NOTE — Progress Notes (Signed)
Patient discussed and independently interviewed/examined with Mr. Carlis Abbott. Agree with assessment and plan of care per his note. History of IGA nephropathy followed by nephrologist. Labs obtained below. Recommended colonoscopy, and when she is ready to call or email me and I can place referral without OV.

## 2014-07-20 LAB — IFOBT (OCCULT BLOOD): IFOBT: NEGATIVE

## 2014-08-10 ENCOUNTER — Ambulatory Visit
Admission: RE | Admit: 2014-08-10 | Discharge: 2014-08-10 | Disposition: A | Discharge status: Home / Self Care | Payer: BC Managed Care – PPO | Source: Ambulatory Visit

## 2014-08-10 DIAGNOSIS — Z1231 Encounter for screening mammogram for malignant neoplasm of breast: Secondary | ICD-10-CM

## 2014-09-10 ENCOUNTER — Ambulatory Visit (INDEPENDENT_AMBULATORY_CARE_PROVIDER_SITE_OTHER): Payer: BLUE CROSS/BLUE SHIELD | Admitting: *Deleted

## 2014-09-10 DIAGNOSIS — Z23 Encounter for immunization: Secondary | ICD-10-CM

## 2014-09-10 NOTE — Progress Notes (Signed)
   Subjective:    Patient ID: April Banks, female    DOB: 1956/01/28, 59 y.o.   MRN: [id number]  HPI  Pt here for pneumonia vaccine.  Review of Systems     Objective:   Physical Exam        Assessment & Plan:

## 2014-09-11 ENCOUNTER — Other Ambulatory Visit: Payer: Self-pay | Admitting: Family Medicine

## 2014-10-20 ENCOUNTER — Encounter: Payer: Self-pay | Admitting: *Deleted

## 2014-10-21 ENCOUNTER — Encounter: Payer: Self-pay | Admitting: *Deleted

## 2014-12-11 ENCOUNTER — Other Ambulatory Visit: Payer: Self-pay | Admitting: Family Medicine

## 2015-02-25 ENCOUNTER — Other Ambulatory Visit: Payer: Self-pay | Admitting: Family Medicine

## 2015-02-28 ENCOUNTER — Ambulatory Visit (INDEPENDENT_AMBULATORY_CARE_PROVIDER_SITE_OTHER): Payer: BLUE CROSS/BLUE SHIELD | Admitting: Family Medicine

## 2015-02-28 ENCOUNTER — Ambulatory Visit (INDEPENDENT_AMBULATORY_CARE_PROVIDER_SITE_OTHER): Payer: BLUE CROSS/BLUE SHIELD

## 2015-02-28 VITALS — BP 122/74 | HR 90 | Temp 97.8°F | Resp 16 | Ht 64.0 in | Wt 210.0 lb

## 2015-02-28 DIAGNOSIS — M7670 Peroneal tendinitis, unspecified leg: Secondary | ICD-10-CM

## 2015-02-28 DIAGNOSIS — M79672 Pain in left foot: Secondary | ICD-10-CM | POA: Diagnosis not present

## 2015-02-28 DIAGNOSIS — M7672 Peroneal tendinitis, left leg: Secondary | ICD-10-CM

## 2015-02-28 DIAGNOSIS — M79675 Pain in left toe(s): Secondary | ICD-10-CM

## 2015-02-28 LAB — URIC ACID: Uric Acid, Serum: 11.1 mg/dL — ABNORMAL HIGH (ref 2.4–7.0)

## 2015-02-28 NOTE — Patient Instructions (Signed)
Gout is a possibility, but I'm also concerned about the swelling and discomfort into your lateral foot.  This could have been reactive to how you were walking after the initial discomfort or could be a stress injury.  Wear the boot for the next 10-14 days, then follow-up to recheck. If you're still having pain in the foot at that time, I would recommend an MRI to make sure there is no early stress injury of the foot bones. Return to the clinic or go to the nearest emergency room if any of your symptoms worsen or new symptoms occur.

## 2015-02-28 NOTE — Progress Notes (Addendum)
Subjective:    Patient ID: April Banks, female    DOB: 1956/01/27, 59 y.o.   MRN: [id number] This chart was scribed for [name], MD by Zola Button, Medical Scribe. This patient was seen in Room 1 and the patient's care was started at 8:53 AM.    HPI HPI Comments: April Banks is a 59 y.o. female with a history of IGA nephropathy, hypertension, MVP, hyperlipidemia who presents to the [facility] complaining of worsening, aching, distal left foot pain that started 3 months ago. Patient notes that she did push exercise harder the day her symptoms started; she exercised in the gym and the pool after a few weeks of inactivity. At that time, she had some difficulty sleeping. After 2 days, her symptoms had worsened. She went on a trip to [place] and notes that she had severe pain in her foot with each step. She also noticed swelling in her foot by the end of the night, and she could not fit her shoe on due to the swelling. While she was on the trip, she did consume seafood, shellfish and wine. A few days into her trip, her pain worsened; she had difficulty sleeping because having the bed sheet touch her foot would worsen the pain. She had been taking Tylenol and hydrocodone from a previous surgery for the pain. Someone had suggested to her that she may have gout. After receiving clearance with a physician over the phone, she tried colchicine BID for 3 days, which did provide relief. Overall, her symptoms lasted 10 days, but have not resolved since then. She is still having mild symptoms. Currently, she describes having an aching discomfort in her foot that ranges from a 1-5/10 in severity. The pain has since wrapped around her foot. She has been avoiding shellfish since her trip. She cannot attribute the pain to any activity. Patient denies knee pain. She has been consistently exercising 3 days a week with a trainer.   Last seen for physical in November, 2015.   Patient Active  Problem List   Diagnosis Date Noted  . HTN (hypertension) 08/25/2011  . MVP (mitral valve prolapse) 08/25/2011  . Herpes genitalis in women 08/25/2011  . Other and unspecified hyperlipidemia 09/04/2010  . OBESITY, UNSPECIFIED 09/04/2010  . ESSENTIAL HYPERTENSION, BENIGN 09/04/2010  . IGA NEPHROPATHY 07/31/2010   Past Medical History  Diagnosis Date  . Renal abscess   . Basal cell carcinoma of leg   . Nephrolithiasis   . Anemia   . Heart murmur   . Hyperlipidemia   . Hypertension   . Anxiety   . Depression   . Fat necrosis (segmental) of breast 02/15/2012   Past Surgical History  Procedure Laterality Date  . Abdominal hysterectomy    . Arthroplasty w/ arthroscopy medial / lateral compartment knee    . Renal biopsy  2010  . Breast enhancement surgery  1990  . Breast surgery    . Eye surgery    . Tubal ligation     Allergies  Allergen Reactions  . Gantrisin [Sulfisoxazole] Shortness Of Breath and Nausea Only  . Sulfa Antibiotics Shortness Of Breath and Rash  . Ceclor [Cefaclor] Rash  . Macrodantin Nausea And Vomiting  . Lisinopril Cough  . Pyridium [Phenazopyridine Hcl]   . Pyridium [Phenazopyridine Hcl] Nausea Only   Prior to Admission medications   Medication Sig Start Date End Date Taking? Authorizing Provider  acetaminophen (TYLENOL) 500 MG tablet Take 500 mg by mouth  every 6 (six) hours as needed.   Yes Historical Provider, MD  acyclovir (ZOVIRAX) 200 MG capsule TAKE ONE CAPSULE BY MOUTH EVERY DAY 03/29/14  Yes Wendie Agreste, MD  aspirin 81 MG tablet Take 81 mg by mouth daily.     Yes Historical Provider, MD  diltiazem (CARTIA XT) 180 MG 24 hr capsule Take 1 capsule (180 mg total) by mouth daily. PATIENT NEEDS OFFICE VISIT FOR ADDITIONAL REFILLS 12/14/14  Yes Jaynee Eagles, PA-C  fish oil-omega-3 fatty acids 1000 MG capsule Take 2 g by mouth 3 (three) times daily.     Yes Historical Provider, MD  furosemide (LASIX) 20 MG tablet Take 3 tablets (60 mg total) by mouth  daily. PATIENT NEEDS OFFICE VISIT FOR ADDITIONAL REFILLS 12/14/14  Yes Jaynee Eagles, PA-C  potassium citrate (UROCIT-K) 10 MEQ (1080 MG) SR tablet TAKE 1 TABLET EVERY DAY BEFORE BREAKFAST 09/13/14  Yes Wendie Agreste, MD  rosuvastatin (CRESTOR) 10 MG tablet Take 10 mg by mouth daily.   Yes Historical Provider, MD  valsartan (DIOVAN) 80 MG tablet TAKE 1 TABLET DAILY 09/13/14  Yes Wendie Agreste, MD   History   Social History  . Marital Status: Divorced    Spouse Name: N/A  . Number of Children: N/A  . Years of Education: N/A   Occupational History  . Not on file.   Social History Main Topics  . Smoking status: Never Smoker   . Smokeless tobacco: Never Used  . Alcohol Use: 0.0 oz/week     Comment: rare occasion  . Drug Use: No  . Sexual Activity: Not on file   Other Topics Concern  . Not on file   Social History Narrative   Divorced   Engineer, structural:  [employer]   Exercises     Review of Systems  Constitutional: Positive for activity change.  Musculoskeletal: Positive for arthralgias.       Objective:   Physical Exam  Constitutional: She is oriented to person, place, and time. She appears well-developed and well-nourished. No distress.  HENT:  Head: Normocephalic and atraumatic.  Mouth/Throat: Oropharynx is clear and moist. No oropharyngeal exudate.  Eyes: Pupils are equal, round, and reactive to light.  Neck: Neck supple.  Cardiovascular: Normal rate.   Pulmonary/Chest: Effort normal.  Musculoskeletal: She exhibits no edema.  Left knee non-tender, full ROM. Slightly positive Tinel's over the fibular head with paraesthesia of to dorsum of foot.  Left tib fib non-tender.  Left ankle: Full ROM, no bony tenderness. Negative Tinel's at the tarsal tunnel.   Left foot: Plantar fascia non-tender. Toes are equal temperature to the right side. NVI distally. Slight discomfort on the lateral foot at the area of peroneal tendon including the insertion of peroneal  tendon at 5th MT. Negative lateral squeeze of the MT heads. MT heads non-tender. No pain with 1st MTP motion. Slight discomfort with eversion but full ROM of the foot.  Neurological: She is alert and oriented to person, place, and time. No cranial nerve deficit.  Skin: Skin is warm and dry. No rash noted.  Psychiatric: She has a normal mood and affect. Her behavior is normal.  Vitals reviewed.  [facility] (PRIMARY) x-ray report read by Dr. Carlota Raspberry: Left foot - No apparent fracture. Small area of calcification at base of 5th MT styloid and lateral to the cuboid. No apparent acute findings.    Filed Vitals:   02/28/15 0832  BP: 122/74  Pulse: 90  Temp: 97.8 F (36.6  C)  TempSrc: Oral  Resp: 16  Height: $Remove'5\' 4"'Rhwryqd$  (1.626 m)  Weight: 210 lb (95.255 kg)  SpO2: 96%       Assessment & Plan:   April Banks is a 59 y.o. female Left foot pain - Plan: Uric Acid, DG Foot Complete Left  Great toe pain, left - Plan: Uric Acid, DG Foot Complete Left  Peroneal tendinitis of left lower leg   based on her initial description of symptoms, may has been initially a gout flare. Then lateral foot pain possibly peroneal strain or  Tendinitis due to change in gait with foot pain.  With regular exercise and slight increase in activity that day, differential diagnosis includes a stress injury of metatarsals.   Although not limping at present, will restrict activity somewhat with short Cam Walker for next 10-14 days, then recheck.  Range of motion discussed out of Cam Walker at least daily.   Check uric acid as possible gout.   If still painful  at next visit, consider MRI to rule out stress injury as her activity and travel will increase substantially after that time.   Return sooner if worse.   No orders of the defined types were placed in this encounter.   Patient Instructions  Gout is a possibility, but I'm also concerned about the swelling and discomfort into your lateral foot.  This could have been reactive to  how you were walking after the initial discomfort or could be a stress injury.  Wear the boot for the next 10-14 days, then follow-up to recheck. If you're still having pain in the foot at that time, I would recommend an MRI to make sure there is no early stress injury of the foot bones. Return to the clinic or go to the nearest emergency room if any of your symptoms worsen or new symptoms occur.     I personally performed the services described in this documentation, which was scribed in my presence. The recorded information has been reviewed and considered, and addended by me as needed.

## 2015-03-11 ENCOUNTER — Ambulatory Visit (INDEPENDENT_AMBULATORY_CARE_PROVIDER_SITE_OTHER): Payer: BLUE CROSS/BLUE SHIELD | Admitting: Family Medicine

## 2015-03-11 VITALS — BP 126/80 | HR 74 | Temp 98.3°F | Resp 16 | Ht 65.0 in | Wt 214.8 lb

## 2015-03-11 DIAGNOSIS — R936 Abnormal findings on diagnostic imaging of limbs: Secondary | ICD-10-CM | POA: Diagnosis not present

## 2015-03-11 DIAGNOSIS — M25572 Pain in left ankle and joints of left foot: Secondary | ICD-10-CM

## 2015-03-11 DIAGNOSIS — M109 Gout, unspecified: Secondary | ICD-10-CM

## 2015-03-11 DIAGNOSIS — M10072 Idiopathic gout, left ankle and foot: Secondary | ICD-10-CM

## 2015-03-11 MED ORDER — COLCHICINE 0.6 MG PO TABS: ORAL_TABLET | ORAL | Status: DC

## 2015-03-11 NOTE — Progress Notes (Signed)
Subjective:  This chart was scribed for April Ray, MD by April Banks, ED Scribe. This patient was seen in room 8 and the patient's care was started at 4:44 PM.   Patient ID: April Banks, female    DOB: 1956-06-08, 59 y.o.   MRN: [id number]  HPI   Chief Complaint  Patient presents with  . Follow-up    foot, x 3 months   HPI Comments: April Banks is a 59 y.o. female who presents to the [facility] for a follow up of left foot pain. Pain was present for 3 months when seen last visit. Small area of calcification proximal to 5th metatarsal on her XR with possible age uncertain avulsion injury. However on exam appeared to be more perineal pain or tendonitis. She was placed in cam walker and uric acid was checked to rule out gout as the initial cause of pain. Uric acid was elevated at 11.1.  Pt reports improvement with pain since last visit, but still has continued pain. She came out of the boot for a couple days to run errands but reports improvement with wearing boot. While wearing the boot her pain she does not have pain, but when taking the boot off she has localized pain to lateral aspect of left foot when taking the boot off.    Patient Active Problem List   Diagnosis Date Noted  . HTN (hypertension) 08/25/2011  . MVP (mitral valve prolapse) 08/25/2011  . Herpes genitalis in women 08/25/2011  . Other and unspecified hyperlipidemia 09/04/2010  . OBESITY, UNSPECIFIED 09/04/2010  . ESSENTIAL HYPERTENSION, BENIGN 09/04/2010  . IGA NEPHROPATHY 07/31/2010   Past Medical History  Diagnosis Date  . Renal abscess   . Basal cell carcinoma of leg   . Nephrolithiasis   . Anemia   . Heart murmur   . Hyperlipidemia   . Hypertension   . Anxiety   . Depression   . Fat necrosis (segmental) of breast 02/15/2012   Past Surgical History  Procedure Laterality Date  . Abdominal hysterectomy    . Arthroplasty w/ arthroscopy medial / lateral compartment knee    .  Renal biopsy  2010  . Breast enhancement surgery  1990  . Breast surgery    . Eye surgery    . Tubal ligation     Allergies  Allergen Reactions  . Gantrisin [Sulfisoxazole] Shortness Of Breath and Nausea Only  . Sulfa Antibiotics Shortness Of Breath and Rash  . Ceclor [Cefaclor] Rash  . Macrodantin Nausea And Vomiting  . Lisinopril Cough  . Pyridium [Phenazopyridine Hcl]   . Pyridium [Phenazopyridine Hcl] Nausea Only   Prior to Admission medications   Medication Sig Start Date End Date Taking? Authorizing Provider  acetaminophen (TYLENOL) 500 MG tablet Take 500 mg by mouth every 6 (six) hours as needed.   Yes Historical Provider, MD  acyclovir (ZOVIRAX) 200 MG capsule TAKE ONE CAPSULE BY MOUTH EVERY DAY 03/29/14  Yes Wendie Agreste, MD  aspirin 81 MG tablet Take 81 mg by mouth daily.     Yes Historical Provider, MD  diltiazem (CARTIA XT) 180 MG 24 hr capsule Take 1 capsule (180 mg total) by mouth daily. PATIENT NEEDS OFFICE VISIT FOR ADDITIONAL REFILLS 12/14/14  Yes Jaynee Eagles, PA-C  fish oil-omega-3 fatty acids 1000 MG capsule Take 2 g by mouth 3 (three) times daily.     Yes Historical Provider, MD  furosemide (LASIX) 20 MG tablet Take 3 tablets (60 mg  total) by mouth daily. PATIENT NEEDS OFFICE VISIT FOR ADDITIONAL REFILLS 12/14/14  Yes Jaynee Eagles, PA-C  potassium citrate (UROCIT-K) 10 MEQ (1080 MG) SR tablet TAKE 1 TABLET DAILY BEFORE BREAKFAST 02/28/15  Yes Wendie Agreste, MD  rosuvastatin (CRESTOR) 10 MG tablet Take 10 mg by mouth daily.   Yes Historical Provider, MD  valsartan (DIOVAN) 80 MG tablet TAKE 1 TABLET DAILY 09/13/14  Yes Wendie Agreste, MD   History   Social History  . Marital Status: Divorced    Spouse Name: N/A  . Number of Children: N/A  . Years of Education: N/A   Occupational History  . Not on file.   Social History Main Topics  . Smoking status: Never Smoker   . Smokeless tobacco: Never Used  . Alcohol Use: 1.2 oz/week    2 Standard drinks or equivalent  per week     Comment: rare occasion  . Drug Use: No  . Sexual Activity: Not on file   Other Topics Concern  . Not on file   Social History Narrative   Divorced   Engineer, structural:  [employer]   Exercises   Review of Systems  Objective:   Physical Exam  Constitutional: She is oriented to person, place, and time. She appears well-developed and well-nourished. No distress.  HENT:  Head: Normocephalic and atraumatic.  Eyes: Conjunctivae and EOM are normal.  Neck: Neck supple.  Cardiovascular: Normal rate.   Pulmonary/Chest: Effort normal.  Musculoskeletal: Normal range of motion.  Left leg-perineal and lateral leg no tender. Neg squeeze. Left ankle- Full ROM no malleolus tenderness. Achilles non tender Left foot- Negative calcaneous squeeze. Navicula non tender. She has focal tenderness to proximal 5th metatarsal. Diaphysis non tender.   Neurological: She is alert and oriented to person, place, and time.  Skin: Skin is warm and dry.  Psychiatric: She has a normal mood and affect. Her behavior is normal.  Nursing note and vitals reviewed.  Filed Vitals:   03/11/15 1553  BP: 126/80  Pulse: 74  Temp: 98.3 F (36.8 C)  TempSrc: Oral  Resp: 16  Height: $Remove'5\' 5"'IfsgIXs$  (1.651 m)  Weight: 214 lb 12.8 oz (97.433 kg)  SpO2: 98%   Assessment & Plan:   April Banks is a 59 y.o. 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 Suspicious for stress injury versus pain from small avulsion at the fifth metatarsal styloid seen on x-Banks. Some improvement with wearing the Cam Walker, but with persistent pain coming out of Cam Walker and location of pain, along with upcoming increased activity, decided  on checking MRI for further delineation of injury and determination of necessary splinting or if orthopedic follow-up needed.  -Continue Cam Walker for now until MRI results reviewed.  -Colchicine prescribed if recurrence of gout. Trigger avoidance discussed, and if using colchicine advised to use lowest effective dose needed with no more than 2 tablets initially and 1 tablet in one hour. Discussed possible reactions with her other medicines, so not to use unless needed. Understanding expressed. Meds ordered this encounter  Medications  . colchicine 0.6 MG tablet    Sig: 2 tablets by mouth once at onset of  symptoms, then 1 tablet by mouth in one hour if needed    Dispense:  15 tablet    Refill:  0   Patient Instructions  If symptoms of gout return, take 2 of the colchicine followed by 1 in 1 hour if needed.  There are some risks with colchicine and to every other medicines, so would use lowest effective dose and take no more than 3 tablets per occurrence. See information below on foods to avoid and other treatment for gout. If recurrent flares, we can look at other daily medication to lessen chance of recurrence.   For your foot pain, I will arrange for an MRI, then we can determine appropriate bracing or follow-up.  Gout Gout is an inflammatory arthritis caused by a buildup of uric acid crystals in the joints. Uric acid is a chemical that is normally present in the blood. When the level of uric acid in the blood is too high it can form crystals that deposit in your joints and tissues. This causes joint redness, soreness, and swelling (inflammation). Repeat attacks are common. Over time, uric acid crystals can form into masses (tophi) near a joint, destroying bone and causing disfigurement. Gout is treatable and often preventable. CAUSES  The disease begins with elevated levels of uric acid in the blood. Uric acid is produced by your body when it breaks down a naturally found substance called  purines. Certain foods you eat, such as meats and fish, contain high amounts of purines. Causes of an elevated uric acid level include:  Being passed down from parent to child (heredity).  Diseases that cause increased uric acid production (such as obesity, psoriasis, and certain cancers).  Excessive alcohol use.  Diet, especially diets rich in meat and seafood.  Medicines, including certain cancer-fighting medicines (chemotherapy), water pills (diuretics), and aspirin.  Chronic kidney disease. The kidneys are no longer able to remove uric acid well.  Problems with metabolism. Conditions strongly associated with gout include:  Obesity.  High blood pressure.  High cholesterol.  Diabetes. Not everyone with elevated uric acid levels gets gout. It is not understood why some people get gout and others do not. Surgery, joint injury, and eating too much of certain foods are some of the factors that can lead to gout attacks. SYMPTOMS   An attack of gout comes on quickly. It causes intense pain with redness, swelling, and warmth in a joint.  Fever can occur.  Often, only one joint is involved. Certain joints are more commonly involved:  Base of the big toe.  Knee.  Ankle.  Wrist.  Finger. Without treatment, an attack usually goes away in a few days to weeks. Between attacks, you usually will not have symptoms, which is different from many other forms of arthritis. DIAGNOSIS  Your caregiver will suspect gout based on your symptoms and exam. In some cases, tests may be recommended. The tests may include:  Blood tests.  Urine tests.  X-rays.  Joint fluid exam. This exam requires a needle to remove fluid from the joint (arthrocentesis). Using a microscope, gout is confirmed when uric acid crystals are seen in the joint fluid. TREATMENT  There are two phases to gout treatment: treating the sudden onset (acute) attack and preventing attacks (prophylaxis).  Treatment of an  Acute Attack.  Medicines are used. These include anti-inflammatory medicines or steroid medicines.  An injection of steroid medicine into the affected joint is sometimes necessary.  The painful joint is rested. Movement can worsen the arthritis.  You may use warm or cold treatments on painful joints, depending which works best for you.  Treatment to Prevent Attacks.  If you suffer from frequent gout attacks, your caregiver may advise preventive medicine. These medicines are started after the acute attack subsides. These medicines either help your kidneys eliminate uric acid from your body or decrease your uric acid production. You may need to stay on these medicines for a very long time.  The early phase of treatment with preventive medicine can be associated with an increase in acute gout attacks. For this reason, during the first few months of treatment, your caregiver may also advise you to take medicines usually used for acute gout treatment. Be sure you understand your caregiver's directions. Your caregiver may make several adjustments to your medicine dose before these medicines are effective.  Discuss dietary treatment with your caregiver or dietitian. Alcohol and drinks high in sugar and fructose and foods such as meat, poultry, and seafood can increase uric acid levels. Your caregiver or dietitian can advise you on drinks and foods that should be limited. HOME CARE INSTRUCTIONS   Do not take aspirin to relieve pain. This raises uric acid levels.  Only take over-the-counter or prescription medicines for pain, discomfort, or fever as directed by your caregiver.  Rest the joint as much as possible. When in bed, keep sheets and blankets off painful areas.  Keep the affected joint raised (elevated).  Apply warm or cold treatments to painful joints. Use of warm or cold treatments depends on which works best for you.  Use crutches if the painful joint is in your leg.  Drink enough  fluids to keep your urine clear or pale yellow. This helps your body get rid of uric acid. Limit alcohol, sugary drinks, and fructose drinks.  Follow your dietary instructions. Pay careful attention to the amount of protein you eat. Your daily diet should emphasize fruits, vegetables, whole grains, and fat-free or low-fat milk products. Discuss the use of coffee, vitamin C, and cherries with your caregiver or dietitian. These may be helpful in lowering uric acid levels.  Maintain a healthy body weight. SEEK MEDICAL CARE IF:   You develop diarrhea, vomiting, or any side effects from medicines.  You do not feel better in 24 hours, or you are getting worse. SEEK IMMEDIATE MEDICAL CARE IF:   Your joint becomes suddenly more tender, and you have chills or a fever. MAKE SURE YOU:   Understand these instructions.  Will watch your condition.  Will get help right away if you are not doing well or get worse. Document Released: 07/27/2000 Document Revised: 12/14/2013 Document Reviewed: 03/12/2012 [facility] Patient Information 2015 [place], [place]. This information is not intended to replace advice given to you by your health care provider. Make sure you discuss any questions you have with your health care provider.     I personally performed the services described in this documentation, which was scribed in my presence. The recorded information has been reviewed and considered, and addended by me as needed.

## 2015-03-11 NOTE — Patient Instructions (Addendum)
If symptoms of gout return, take 2 of the colchicine followed by 1 in 1 hour if needed.  There are some risks with colchicine and to every other medicines, so would use lowest effective dose and take no more than 3 tablets per occurrence. See information below on foods to avoid and other treatment for gout. If recurrent flares, we can look at other daily medication to lessen chance of recurrence.   For your foot pain, I will arrange for an MRI, then we can determine appropriate bracing or follow-up.  Gout Gout is an inflammatory arthritis caused by a buildup of uric acid crystals in the joints. Uric acid is a chemical that is normally present in the blood. When the level of uric acid in the blood is too high it can form crystals that deposit in your joints and tissues. This causes joint redness, soreness, and swelling (inflammation). Repeat attacks are common. Over time, uric acid crystals can form into masses (tophi) near a joint, destroying bone and causing disfigurement. Gout is treatable and often preventable. CAUSES  The disease begins with elevated levels of uric acid in the blood. Uric acid is produced by your body when it breaks down a naturally found substance called purines. Certain foods you eat, such as meats and fish, contain high amounts of purines. Causes of an elevated uric acid level include:  Being passed down from parent to child (heredity).  Diseases that cause increased uric acid production (such as obesity, psoriasis, and certain cancers).  Excessive alcohol use.  Diet, especially diets rich in meat and seafood.  Medicines, including certain cancer-fighting medicines (chemotherapy), water pills (diuretics), and aspirin.  Chronic kidney disease. The kidneys are no longer able to remove uric acid well.  Problems with metabolism. Conditions strongly associated with gout include:  Obesity.  High blood pressure.  High cholesterol.  Diabetes. Not everyone with elevated  uric acid levels gets gout. It is not understood why some people get gout and others do not. Surgery, joint injury, and eating too much of certain foods are some of the factors that can lead to gout attacks. SYMPTOMS   An attack of gout comes on quickly. It causes intense pain with redness, swelling, and warmth in a joint.  Fever can occur.  Often, only one joint is involved. Certain joints are more commonly involved:  Base of the big toe.  Knee.  Ankle.  Wrist.  Finger. Without treatment, an attack usually goes away in a few days to weeks. Between attacks, you usually will not have symptoms, which is different from many other forms of arthritis. DIAGNOSIS  Your caregiver will suspect gout based on your symptoms and exam. In some cases, tests may be recommended. The tests may include:  Blood tests.  Urine tests.  X-rays.  Joint fluid exam. This exam requires a needle to remove fluid from the joint (arthrocentesis). Using a microscope, gout is confirmed when uric acid crystals are seen in the joint fluid. TREATMENT  There are two phases to gout treatment: treating the sudden onset (acute) attack and preventing attacks (prophylaxis).  Treatment of an Acute Attack.  Medicines are used. These include anti-inflammatory medicines or steroid medicines.  An injection of steroid medicine into the affected joint is sometimes necessary.  The painful joint is rested. Movement can worsen the arthritis.  You may use warm or cold treatments on painful joints, depending which works best for you.  Treatment to Prevent Attacks.  If you suffer from frequent gout attacks,  your caregiver may advise preventive medicine. These medicines are started after the acute attack subsides. These medicines either help your kidneys eliminate uric acid from your body or decrease your uric acid production. You may need to stay on these medicines for a very long time.  The early phase of treatment with  preventive medicine can be associated with an increase in acute gout attacks. For this reason, during the first few months of treatment, your caregiver may also advise you to take medicines usually used for acute gout treatment. Be sure you understand your caregiver's directions. Your caregiver may make several adjustments to your medicine dose before these medicines are effective.  Discuss dietary treatment with your caregiver or dietitian. Alcohol and drinks high in sugar and fructose and foods such as meat, poultry, and seafood can increase uric acid levels. Your caregiver or dietitian can advise you on drinks and foods that should be limited. HOME CARE INSTRUCTIONS   Do not take aspirin to relieve pain. This raises uric acid levels.  Only take over-the-counter or prescription medicines for pain, discomfort, or fever as directed by your caregiver.  Rest the joint as much as possible. When in bed, keep sheets and blankets off painful areas.  Keep the affected joint raised (elevated).  Apply warm or cold treatments to painful joints. Use of warm or cold treatments depends on which works best for you.  Use crutches if the painful joint is in your leg.  Drink enough fluids to keep your urine clear or pale yellow. This helps your body get rid of uric acid. Limit alcohol, sugary drinks, and fructose drinks.  Follow your dietary instructions. Pay careful attention to the amount of protein you eat. Your daily diet should emphasize fruits, vegetables, whole grains, and fat-free or low-fat milk products. Discuss the use of coffee, vitamin C, and cherries with your caregiver or dietitian. These may be helpful in lowering uric acid levels.  Maintain a healthy body weight. SEEK MEDICAL CARE IF:   You develop diarrhea, vomiting, or any side effects from medicines.  You do not feel better in 24 hours, or you are getting worse. SEEK IMMEDIATE MEDICAL CARE IF:   Your joint becomes suddenly more  tender, and you have chills or a fever. MAKE SURE YOU:   Understand these instructions.  Will watch your condition.  Will get help right away if you are not doing well or get worse. Document Released: 07/27/2000 Document Revised: 12/14/2013 Document Reviewed: 03/12/2012 [facility] Patient Information 2015 [place], [place]. This information is not intended to replace advice given to you by your health care provider. Make sure you discuss any questions you have with your health care provider.

## 2015-03-13 ENCOUNTER — Other Ambulatory Visit: Payer: Self-pay | Admitting: Urgent Care

## 2015-03-14 NOTE — Telephone Encounter (Signed)
Dr Carlota Raspberry, you just saw pt for other issues last week, but don't see these meds discussed recently. Do you want to RF, or pt to RTC?

## 2015-03-15 NOTE — Telephone Encounter (Signed)
refilled. Can discuss at next follow-up.

## 2015-03-16 ENCOUNTER — Telehealth: Payer: Self-pay

## 2015-03-16 NOTE — Telephone Encounter (Signed)
Completed form for PA for [facility] except need to ask pt if she tried any NSAIDS or prednisone for gout. LMOM for pt to CB with answer to this so that I can fax form in to ins.

## 2015-03-17 ENCOUNTER — Ambulatory Visit
Admission: RE | Admit: 2015-03-17 | Discharge: 2015-03-17 | Disposition: A | Discharge status: Home / Self Care | Payer: BLUE CROSS/BLUE SHIELD | Source: Ambulatory Visit | Attending: Family Medicine | Admitting: Family Medicine

## 2015-03-17 DIAGNOSIS — M25572 Pain in left ankle and joints of left foot: Secondary | ICD-10-CM

## 2015-03-17 DIAGNOSIS — R936 Abnormal findings on diagnostic imaging of limbs: Secondary | ICD-10-CM

## 2015-03-17 NOTE — Telephone Encounter (Signed)
Pt CB and advised that due to kidney disease, she is not able to take the other medications, only tylenol which doesn't help w/gout pain. Completed PA w/info that these other meds are contraindicated and faxed w/confirmation. Pending.

## 2015-03-18 ENCOUNTER — Ambulatory Visit (INDEPENDENT_AMBULATORY_CARE_PROVIDER_SITE_OTHER): Payer: BLUE CROSS/BLUE SHIELD | Admitting: Family Medicine

## 2015-03-18 VITALS — BP 122/80 | HR 82 | Temp 98.4°F | Resp 16 | Ht 65.0 in | Wt 213.2 lb

## 2015-03-18 DIAGNOSIS — M1000 Idiopathic gout, unspecified site: Secondary | ICD-10-CM | POA: Diagnosis not present

## 2015-03-18 DIAGNOSIS — M10072 Idiopathic gout, left ankle and foot: Secondary | ICD-10-CM | POA: Diagnosis not present

## 2015-03-18 DIAGNOSIS — M109 Gout, unspecified: Secondary | ICD-10-CM | POA: Insufficient documentation

## 2015-03-18 MED ORDER — COLCHICINE 0.6 MG PO TABS: ORAL_TABLET | ORAL | Status: DC

## 2015-03-18 MED ORDER — ALLOPURINOL 100 MG PO TABS: 100.0000 mg | ORAL_TABLET | Freq: Every day | ORAL | Status: DC

## 2015-03-18 NOTE — Progress Notes (Signed)
[facility] [address] [place] [address] [phone]  Date:  03/18/2015   Name:  April Banks   DOB:  1956-03-25   MRN:  [id number]  PCP:  Wendie Agreste, MD    Chief Complaint: Follow-up   History of Present Illness:  April Banks is a 59 y.o. very pleasant female patient who presents with the following:  She has been seen a few times recently with left foot pain.  There was a question of gout vs other cause.   Her MRI from yesterday was negative for any fracture.  However she is here today because her pain came back over the last 2 days- she thinks she has a gout flare.  She would like to have an rx for colchicine  She has tried some colchicine in the past and it did help.  However she was just borrowing this from a friend and needs an rx of her own She does have a history of renal disease- IgA nephropathy. Most recent creat 1.58- this gives a creat clearance of 41 She is wearing her CAM boot but does not think it is really helping her at this point- she would like to use a regular shoe now if possible  Patient Active Problem List   Diagnosis Date Noted  . HTN (hypertension) 08/25/2011  . MVP (mitral valve prolapse) 08/25/2011  . Herpes genitalis in women 08/25/2011  . Other and unspecified hyperlipidemia 09/04/2010  . OBESITY, UNSPECIFIED 09/04/2010  . ESSENTIAL HYPERTENSION, BENIGN 09/04/2010  . IGA NEPHROPATHY 07/31/2010    Past Medical History  Diagnosis Date  . Renal abscess   . Basal cell carcinoma of leg   . Nephrolithiasis   . Anemia   . Heart murmur   . Hyperlipidemia   . Hypertension   . Anxiety   . Depression   . Fat necrosis (segmental) of breast 02/15/2012    Past Surgical History  Procedure Laterality Date  . Abdominal hysterectomy    . Arthroplasty w/ arthroscopy medial / lateral compartment knee    . Renal biopsy  2010  . Breast enhancement surgery  1990  . Breast surgery    . Eye surgery    . Tubal ligation       History  Substance Use Topics  . Smoking status: Never Smoker   . Smokeless tobacco: Never Used  . Alcohol Use: 1.2 oz/week    2 Standard drinks or equivalent per week     Comment: rare occasion    Family History  Problem Relation Age of Onset  . Heart disease Father   . Skin cancer Father   . Cancer Father   . Hyperlipidemia Father   . Hypertension Father   . Colon cancer Maternal Grandmother   . Breast cancer Maternal Aunt   . Hyperlipidemia Mother     Allergies  Allergen Reactions  . Gantrisin [Sulfisoxazole] Shortness Of Breath and Nausea Only  . Sulfa Antibiotics Shortness Of Breath and Rash  . Ceclor [Cefaclor] Rash  . Macrodantin Nausea And Vomiting  . Lisinopril Cough  . Pyridium [Phenazopyridine Hcl]   . Pyridium [Phenazopyridine Hcl] Nausea Only    Medication list has been reviewed and updated.  Current Outpatient Prescriptions on File Prior to Visit  Medication Sig Dispense Refill  . acetaminophen (TYLENOL) 500 MG tablet Take 500 mg by mouth every 6 (six) hours as needed.    Marland Kitchen acyclovir (ZOVIRAX) 200 MG capsule TAKE ONE CAPSULE BY MOUTH EVERY  DAY 90 capsule 3  . aspirin 81 MG tablet Take 81 mg by mouth daily.      Marland Kitchen diltiazem (CARTIA XT) 180 MG 24 hr capsule Take 1 capsule (180 mg total) by mouth daily. 90 capsule 1  . fish oil-omega-3 fatty acids 1000 MG capsule Take 2 g by mouth 3 (three) times daily.      . furosemide (LASIX) 20 MG tablet Take 3 tablets (60 mg total) by mouth daily. 270 tablet 1  . potassium citrate (UROCIT-K) 10 MEQ (1080 MG) SR tablet TAKE 1 TABLET DAILY BEFORE BREAKFAST 100 tablet 0  . rosuvastatin (CRESTOR) 10 MG tablet Take 10 mg by mouth daily.    . valsartan (DIOVAN) 80 MG tablet TAKE 1 TABLET DAILY 90 tablet 0  . colchicine 0.6 MG tablet 2 tablets by mouth once at onset of symptoms, then 1 tablet by mouth in one hour if needed (Patient not taking: Reported on 03/18/2015) 15 tablet 0   No current facility-administered  medications on file prior to visit.    Review of Systems:  As per HPI- otherwise negative.   Physical Examination: Filed Vitals:   03/18/15 1056  BP: 122/80  Pulse: 82  Temp: 98.4 F (36.9 C)  Resp: 16   Filed Vitals:   03/18/15 1056  Height: $Remove'5\' 5"'URYywAW$  (1.651 m)  Weight: 213 lb 3.2 oz (96.707 kg)   Body mass index is 35.48 kg/(m^2). Ideal Body Weight: Weight in (lb) to have BMI = 25: 149.9  GEN: WDWN, NAD, Non-toxic, A & O x 3, obese, looks well HEENT: Atraumatic, Normocephalic. Neck supple. No masses, No LAD. Ears and Nose: No external deformity. CV: RRR, No M/G/R. No JVD. No thrill. No extra heart sounds. PULM: CTA B, no wheezes, crackles, rhonchi. No retractions. No resp. distress. No accessory muscle use.Marland Kitchen EXTR: No c/c/e NEURO favoring left slightly in CAM boot PSYCH: Normally interactive. Conversant. Not depressed or anxious appearing.  Calm demeanor.  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  After acute flare resolved she plans to start allopurinol for prophylaxis, RTC in one month for uric acid level She will let us know if not improving as expected See patient instructions for more details.     Signed Lamar Blinks, MD

## 2015-03-18 NOTE — Patient Instructions (Signed)
Use the colchicine as directed for acute flair. You should not take your cholesterol med for a couple of days when you use the colchicine Once your acute flare is passed, start on the allopurinol to help decrease your uric acid level Please come by clinic for a uric acid level in about 1 month and we can see where you are Let me know if your toe does not get better with the colchicine as expected

## 2015-04-12 ENCOUNTER — Other Ambulatory Visit: Payer: Self-pay | Admitting: Family Medicine

## 2015-04-19 ENCOUNTER — Ambulatory Visit (INDEPENDENT_AMBULATORY_CARE_PROVIDER_SITE_OTHER): Payer: BLUE CROSS/BLUE SHIELD | Admitting: Emergency Medicine

## 2015-04-19 VITALS — BP 114/60 | HR 90 | Temp 98.1°F | Resp 18 | Ht 64.0 in | Wt 210.0 lb

## 2015-04-19 DIAGNOSIS — M109 Gout, unspecified: Secondary | ICD-10-CM

## 2015-04-19 DIAGNOSIS — N3000 Acute cystitis without hematuria: Secondary | ICD-10-CM | POA: Diagnosis not present

## 2015-04-19 DIAGNOSIS — M10072 Idiopathic gout, left ankle and foot: Secondary | ICD-10-CM

## 2015-04-19 DIAGNOSIS — M545 Low back pain, unspecified: Secondary | ICD-10-CM

## 2015-04-19 LAB — POCT UA - MICROSCOPIC ONLY
Casts, Ur, LPF, POC: NEGATIVE
Crystals, Ur, HPF, POC: NEGATIVE
Mucus, UA: NEGATIVE
YEAST UA: NEGATIVE

## 2015-04-19 LAB — POCT URINALYSIS DIPSTICK
Bilirubin, UA: NEGATIVE
Glucose, UA: NEGATIVE
KETONES UA: NEGATIVE
Nitrite, UA: NEGATIVE
PROTEIN UA: 100
Spec Grav, UA: 1.01
Urobilinogen, UA: 0.2
pH, UA: 6

## 2015-04-19 MED ORDER — TRAMADOL HCL 50 MG PO TABS: 50.0000 mg | ORAL_TABLET | Freq: Four times a day (QID) | ORAL | Status: DC | PRN

## 2015-04-19 MED ORDER — CIPROFLOXACIN HCL 500 MG PO TABS: 500.0000 mg | ORAL_TABLET | Freq: Two times a day (BID) | ORAL | Status: DC

## 2015-04-19 NOTE — Progress Notes (Signed)
Subjective:  Patient ID: April Banks, female    DOB: 12/02/1955  Age: 59 y.o. MRN: [id number]  CC: Follow-up; Back Pain; and Myalgia   HPI [name] presents  she is ill after a visit to an emergency room out in [place] where she was treated for a urinary tract infection with Levaquin. She had such bad dreams and intolerance to medication forces stop. She has still dysuria urgency or frequency and low back pain that is not radiating. She has no fever chills. No nausea vomiting. She has a history of gout and chronic low back pain. Her gout is under control on medication.  History Fiona has a past medical history of Renal abscess; Basal cell carcinoma of leg; Nephrolithiasis; Anemia; Heart murmur; Hyperlipidemia; Hypertension; Anxiety; Depression; and Fat necrosis (segmental) of breast (02/15/2012).   She has past surgical history that includes Abdominal hysterectomy; Arthroplasty w/ arthroscopy medial / lateral compartment knee; Renal biopsy (2010); Breast enhancement surgery (1990); Breast surgery; Eye surgery; and Tubal ligation.   Her  family history includes Breast cancer in her maternal aunt; Cancer in her father; Colon cancer in her maternal grandmother; Heart disease in her father; Hyperlipidemia in her father and mother; Hypertension in her father; Skin cancer in her father.  She   reports that she has never smoked. She has never used smokeless tobacco. She reports that she drinks about 1.2 oz of alcohol per week. She reports that she does not use illicit drugs.  Outpatient Prescriptions Prior to Visit  Medication Sig Dispense Refill  . acetaminophen (TYLENOL) 500 MG tablet Take 500 mg by mouth every 6 (six) hours as needed.    Marland Kitchen acyclovir (ZOVIRAX) 200 MG capsule TAKE 1 CAPSULE DAILY.  "NO MORE REFILLS WITHOUT OV" 90 capsule 0  . allopurinol (ZYLOPRIM) 100 MG tablet Take 1 tablet (100 mg total) by mouth daily. Increase to 3 pills daily over one week 90 tablet 6  . aspirin 81  MG tablet Take 81 mg by mouth daily.      Marland Kitchen diltiazem (CARTIA XT) 180 MG 24 hr capsule Take 1 capsule (180 mg total) by mouth daily. 90 capsule 1  . fish oil-omega-3 fatty acids 1000 MG capsule Take 2 g by mouth 3 (three) times daily.      . furosemide (LASIX) 20 MG tablet Take 3 tablets (60 mg total) by mouth daily. 270 tablet 1  . potassium citrate (UROCIT-K) 10 MEQ (1080 MG) SR tablet TAKE 1 TABLET DAILY BEFORE BREAKFAST 100 tablet 0  . rosuvastatin (CRESTOR) 10 MG tablet Take 10 mg by mouth daily.    . valsartan (DIOVAN) 80 MG tablet TAKE 1 TABLET DAILY (Patient taking differently: TAKE 1/2 TABLET DAILY) 90 tablet 0  . colchicine 0.6 MG tablet 2 tablets by mouth once at onset of symptoms, then 1 tablet by mouth in one hour if needed (Patient not taking: Reported on 04/19/2015) 20 tablet 0   No facility-administered medications prior to visit.    Social History   Social History  . Marital Status: Divorced    Spouse Name: N/A  . Number of Children: N/A  . Years of Education: N/A   Social History Main Topics  . Smoking status: Never Smoker   . Smokeless tobacco: Never Used  . Alcohol Use: 1.2 oz/week    2 Standard drinks or equivalent per week     Comment: rare occasion  . Drug Use: No  . Sexual Activity: Not Asked   Other Topics  Concern  . None   Social History Narrative   Divorced   Engineer, structural:  [employer]   Exercises     Review of Systems  Constitutional: Negative for fever, chills and appetite change.  HENT: Negative for congestion, ear pain, postnasal drip, sinus pressure and sore throat.   Eyes: Negative for pain and redness.  Respiratory: Negative for cough, shortness of breath and wheezing.   Cardiovascular: Negative for leg swelling.  Gastrointestinal: Negative for nausea, vomiting, abdominal pain, diarrhea, constipation and blood in stool.  Endocrine: Negative for polyuria.  Genitourinary: Positive for dysuria, urgency and frequency. Negative for  flank pain.  Musculoskeletal: Positive for back pain. Negative for gait problem.  Skin: Negative for rash.  Neurological: Negative for weakness and headaches.  Psychiatric/Behavioral: Negative for confusion and decreased concentration. The patient is not nervous/anxious.     Objective:  BP 114/60 mmHg  Pulse 90  Temp(Src) 98.1 F (36.7 C) (Oral)  Resp 18  Ht $R'5\' 4"'ks$  (1.626 m)  Wt 210 lb (95.255 kg)  BMI 36.03 kg/m2  SpO2 98%  Physical Exam  Constitutional: She is oriented to person, place, and time. She appears well-developed and well-nourished. No distress.  HENT:  Head: Normocephalic and atraumatic.  Right Ear: External ear normal.  Left Ear: External ear normal.  Nose: Nose normal.  Eyes: Conjunctivae and EOM are normal. Pupils are equal, round, and reactive to light. No scleral icterus.  Neck: Normal range of motion. Neck supple. No tracheal deviation present.  Cardiovascular: Normal rate, regular rhythm and normal heart sounds.   Pulmonary/Chest: Effort normal. No respiratory distress. She has no wheezes. She has no rales.  Abdominal: She exhibits no mass. There is no tenderness. There is no rebound and no guarding.  Musculoskeletal: She exhibits no edema.       Lumbar back: She exhibits tenderness.  Lymphadenopathy:    She has no cervical adenopathy.  Neurological: She is alert and oriented to person, place, and time. Coordination normal.  Skin: Skin is warm and dry. No rash noted.  Psychiatric: She has a normal mood and affect. Her behavior is normal.      Assessment & Plan:   Silvana was seen today for follow-up, back pain and myalgia.  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   I am having Ms. Bevel start on traMADol. I am also having her maintain her aspirin, fish oil-omega-3 fatty acids, rosuvastatin, acetaminophen, valsartan, potassium citrate, diltiazem, furosemide, colchicine, allopurinol, acyclovir, hydrALAZINE, and HYDROcodone-acetaminophen.  Meds ordered this encounter  Medications  . hydrALAZINE (APRESOLINE) 25 MG tablet    Sig: Take 25 mg by mouth 3 (three) times daily.  Marland Kitchen HYDROcodone-acetaminophen (NORCO/VICODIN) 5-325 MG per tablet    Sig: Take 1 tablet by mouth every 6 (six) hours as needed for moderate pain.  . traMADol (ULTRAM) 50 MG tablet    Sig: Take 1 tablet (50 mg total) by mouth every 6 (six) hours as needed.    Dispense:  60 tablet    Refill:  1  Unfortunately she is allergic to Macrobid and sulfa drugs. She didn't tolerate Levaquin. She has taken Cipro number times the past. She's allergic to cephalosporins. She is taken Cipro number times a past   Appropriate red flag conditions were discussed with the patient as well as actions that should be taken.  Patient expressed his understanding.  Follow-up: No Follow-up on file.  Roselee Culver, MD   Results for orders placed or performed in visit on 04/19/15  POCT urinalysis dipstick  Result Value Ref Range   Color, UA yellow    Clarity, UA cloudy    Glucose, UA neg    Bilirubin, UA neg    Ketones, UA neg    Spec Grav, UA 1.010    Blood, UA large    pH, UA 6.0    Protein, UA 100    Urobilinogen, UA 0.2    Nitrite, UA neg    Leukocytes, UA small (1+) (A) Negative

## 2015-04-20 LAB — URINE CULTURE
Colony Count: NO GROWTH
Organism ID, Bacteria: NO GROWTH

## 2015-04-27 ENCOUNTER — Ambulatory Visit (INDEPENDENT_AMBULATORY_CARE_PROVIDER_SITE_OTHER): Payer: BLUE CROSS/BLUE SHIELD | Admitting: Family Medicine

## 2015-04-27 VITALS — BP 130/80 | HR 95 | Temp 98.0°F | Resp 16 | Ht 64.0 in | Wt 209.8 lb

## 2015-04-27 DIAGNOSIS — M7541 Impingement syndrome of right shoulder: Secondary | ICD-10-CM | POA: Diagnosis not present

## 2015-04-27 DIAGNOSIS — M109 Gout, unspecified: Secondary | ICD-10-CM

## 2015-04-27 DIAGNOSIS — N3090 Cystitis, unspecified without hematuria: Secondary | ICD-10-CM | POA: Diagnosis not present

## 2015-04-27 DIAGNOSIS — M1009 Idiopathic gout, multiple sites: Secondary | ICD-10-CM

## 2015-04-27 DIAGNOSIS — M25511 Pain in right shoulder: Secondary | ICD-10-CM | POA: Diagnosis not present

## 2015-04-27 NOTE — Progress Notes (Signed)
Subjective:  This chart was scribed for April Ray, MD by [facility], medical scribe at [facility].The patient was seen in exam room 14 and the patient's care was started at 4:31 PM.   Patient ID: April Banks, female    DOB: [birth year], 59 y.o.   MRN: [id number] Chief Complaint  Patient presents with  . Follow-up    right arm pain  . Follow-up    uric acid    HPI HPI Comments: ADWOA AXE is a 59 y.o. female who presents to [facility] for a follow up regarding her uric acid levels and a right arm pain.  Last seen by me in July, diagnosed with probable gout. She had left food pain at that time, MRI on Aug 4 th showed bursitis and joint infusion, but no fracture. Seen Aug 5 th by Dr. Lorelei Pont for a gout flare up. Prescribed her colchicine and started her on allopurinol. Planned follow up uric acid in one month. On allopurinol for a little over a month. Her nephrologist suggested 200 mg daily. She has been on this dose for 3 weeks. Kidney function checked 2 weeks ago. Creatinine was 2.2. Will follow up with her nephrologist tomorrow. No gout recent flare ups, last was on Aug 5th  Seen in ER in [place]. Treated with Levaquin for a UTI but intolerant for that med. Testing on the 6 th showed indication of persistent UTI. Seen by Dr. Ouida Sills on Sept 6th. Switched from Levaquin to Cipro 500 mg twice daily. No UTI symptoms currently. Off Cipro for 5 days. She noticed a pain in her right arm and right calf, two days after starting Levaquin. No known injury. Right arm pain starts at her shoulder and radiates down her arm, pain lingered rated 7-8/10, but pain has improved over the past two days. Right calf pain has subsided but right shoulder pain has persisted. Cannot take tramadol, this makes her vomit. She has flexeril which does help her pain. Right hand dominant, right arm is her shooting arm.  Patient Active Problem List   Diagnosis Date Noted  .  Gout 03/18/2015  . HTN (hypertension) 08/25/2011  . MVP (mitral valve prolapse) 08/25/2011  . Herpes genitalis in women 08/25/2011  . Other and unspecified hyperlipidemia 09/04/2010  . OBESITY, UNSPECIFIED 09/04/2010  . ESSENTIAL HYPERTENSION, BENIGN 09/04/2010  . IGA NEPHROPATHY 07/31/2010   Past Medical History  Diagnosis Date  . Renal abscess   . Basal cell carcinoma of leg   . Nephrolithiasis   . Anemia   . Heart murmur   . Hyperlipidemia   . Hypertension   . Anxiety   . Depression   . Fat necrosis (segmental) of breast 02/15/2012   Past Surgical History  Procedure Laterality Date  . Abdominal hysterectomy    . Arthroplasty w/ arthroscopy medial / lateral compartment knee    . Renal biopsy  2010  . Breast enhancement surgery  1990  . Breast surgery    . Eye surgery    . Tubal ligation     Allergies  Allergen Reactions  . Gantrisin [Sulfisoxazole] Shortness Of Breath and Nausea Only  . Sulfa Antibiotics Shortness Of Breath and Rash  . Ceclor [Cefaclor] Rash  . Macrodantin Nausea And Vomiting  . Lisinopril Cough  . Pyridium [Phenazopyridine Hcl]   . Pyridium [Phenazopyridine Hcl] Nausea Only   Prior to Admission medications   Medication Sig Start Date End Date Taking? Authorizing Provider  acetaminophen (TYLENOL) 500 MG tablet Take 500 mg by mouth every 6 (six) hours as needed.   Yes Historical Provider, MD  acyclovir (ZOVIRAX) 200 MG capsule TAKE 1 CAPSULE DAILY.  "NO MORE REFILLS WITHOUT OV" 04/12/15  Yes Chelle Jeffery, PA-C  allopurinol (ZYLOPRIM) 100 MG tablet Take 1 tablet (100 mg total) by mouth daily. Increase to 3 pills daily over one week 03/18/15  Yes Gay Filler Copland, MD  aspirin 81 MG tablet Take 81 mg by mouth daily.     Yes Historical Provider, MD  ciprofloxacin (CIPRO) 500 MG tablet Take 1 tablet (500 mg total) by mouth 2 (two) times daily. 04/19/15  Yes Roselee Culver, MD  diltiazem (CARTIA XT) 180 MG 24 hr capsule Take 1 capsule (180 mg total) by  mouth daily. 03/15/15  Yes Wendie Agreste, MD  fish oil-omega-3 fatty acids 1000 MG capsule Take 2 g by mouth 3 (three) times daily.     Yes Historical Provider, MD  furosemide (LASIX) 20 MG tablet Take 3 tablets (60 mg total) by mouth daily. 03/15/15  Yes Wendie Agreste, MD  hydrALAZINE (APRESOLINE) 25 MG tablet Take 25 mg by mouth 3 (three) times daily.   Yes Historical Provider, MD  HYDROcodone-acetaminophen (NORCO/VICODIN) 5-325 MG per tablet Take 1 tablet by mouth every 6 (six) hours as needed for moderate pain.   Yes Historical Provider, MD  potassium citrate (UROCIT-K) 10 MEQ (1080 MG) SR tablet TAKE 1 TABLET DAILY BEFORE BREAKFAST 02/28/15  Yes Wendie Agreste, MD  rosuvastatin (CRESTOR) 10 MG tablet Take 10 mg by mouth daily.   Yes Historical Provider, MD  valsartan (DIOVAN) 80 MG tablet TAKE 1 TABLET DAILY Patient taking differently: TAKE 1/2 TABLET DAILY 09/13/14  Yes Wendie Agreste, MD  colchicine 0.6 MG tablet 2 tablets by mouth once at onset of symptoms, then 1 tablet by mouth in one hour if needed Patient not taking: Reported on 04/19/2015 03/18/15   Gay Filler Copland, MD  traMADol (ULTRAM) 50 MG tablet Take 1 tablet (50 mg total) by mouth every 6 (six) hours as needed. Patient not taking: Reported on 04/27/2015 04/19/15   Roselee Culver, MD   Social History   Social History  . Marital Status: Divorced    Spouse Name: N/A  . Number of Children: N/A  . Years of Education: N/A   Occupational History  . Not on file.   Social History Main Topics  . Smoking status: Never Smoker   . Smokeless tobacco: Never Used  . Alcohol Use: 1.2 oz/week    2 Standard drinks or equivalent per week     Comment: rare occasion  . Drug Use: No  . Sexual Activity: Not on file   Other Topics Concern  . Not on file   Social History Narrative   Divorced   Engineer, structural:  [employer]   Exercises   Review of Systems  Genitourinary: Negative for dysuria, urgency, frequency and flank  pain.  Musculoskeletal: Positive for myalgias and arthralgias. Negative for back pain.       Objective:  BP 130/80 mmHg  Pulse 95  Temp(Src) 98 F (36.7 C) (Oral)  Resp 16  Ht $R'5\' 4"'DC$  (1.626 m)  Wt 209 lb 12.8 oz (95.165 kg)  BMI 35.99 kg/m2  SpO2 98% Physical Exam  Constitutional: She is oriented to person, place, and time. She appears well-developed and well-nourished. No distress.  HENT:  Head: Normocephalic and atraumatic.  Eyes: Pupils are  equal, round, and reactive to light.  Neck: Normal range of motion. Neck supple.  C-spine slight decreased ROM but does not reproduce right shoulder pain.  Cardiovascular: Normal rate, regular rhythm and normal heart sounds.  Exam reveals no gallop and no friction rub.   No murmur heard. Pulmonary/Chest: Effort normal and breath sounds normal. No respiratory distress. She has no wheezes.  Abdominal: Soft. She exhibits no distension. There is no tenderness. There is no CVA tenderness.  Musculoskeletal: Normal range of motion.  Right calf: Foot, achillis, calf non tender. Negative Homans' Right shoulder: Chauncey and AC joint in the clavicle are non tender. No focal or bony tenderness of the right shoulder. Slight decreased extension of her right shoulder. Pain with Internal rotation to the iliac crest. Negative empty can. Full rotator cuff strength. Positive Neer's, and Hawkins'.  Neurological: She is alert and oriented to person, place, and time.  Skin: Skin is warm and dry.  Psychiatric: She has a normal mood and affect. Her behavior is normal.  Nursing note and vitals reviewed.     Assessment & Plan:   MEIA EMLEY is a 59 y.o. femaleI Pain in joint, shoulder region, right, Impingement syndrome of right shoulder  -  Suspected underlying impingement/rotator cuff tendinosis. Differential does include a tendinopathy with prior quinine use, but improving off of quinolone at this point.  -  Recommended against shooting firearms for now  As  recoil may increase pain/symptoms. Handout given on rotator cuff syndrome/impingement. Avoid NSAIDs given renal disease, Tylenol is okay.   Symptomatic care and RTC precautions.  Gouty arthritis  - flare resolved, no recent symptoms. Uric acid level pending, can have this drawn at her  Nephrology visit.  - Based on increased creatinine, and advised to decrease dose of her allopurinol, to 200 or 100. She may need to eat to even stop this temporarily if her creatinine is continuing to increase. Advised her to discuss this with her nephrologist and let him know that  he can change medication as needed based on her kidney function.  Cystitis  - improved after treatment with quinolones.  If any return of urinary symptoms, or abdominal pain/fever, return to clinic for further evaluation. ER if needed.   No orders of the defined types were placed in this encounter.   Patient Instructions  Your shoulder symptoms may be a combination of bursitis and rotator cuff tendinitis. Rarely this can be from quinolone antibiotics such as Levaquin or Cipro. As it is improving the past 2 days, no new medications needed, but avoid repetitive use of that arm, including shooting as recoil could proceed onto rupture of the tendon if it is significantly inflamed. See other information below on this condition. If it is not improving over the next few weeks, return to see me or I can have an orthopedist evaluate you as well.  As far as gout, we may need to decrease to just 100 mg a day or trial off of allopurinol for now if needed based on your kidney function tomorrow. Either one is an option, and this can be up to Dr. Patton Salles. If he has any questions please let me know. Since they are drawing blood tomorrow, it would be helpful to check another uric acid to see where your levels are. If they can't do that at their office, I'll be happy to put a lab only order in here.  If any return of urinary symptoms, abdominal pain, fever,  or any other worsening symptoms,  return to clinic or emergency room. Please let me know if you have any questions.  Impingement Syndrome, Rotator Cuff, Bursitis with Rehab Impingement syndrome is a condition that involves inflammation of the tendons of the rotator cuff and the subacromial bursa, that causes pain in the shoulder. The rotator cuff consists of four tendons and muscles that control much of the shoulder and upper arm function. The subacromial bursa is a fluid filled sac that helps reduce friction between the rotator cuff and one of the bones of the shoulder (acromion). Impingement syndrome is usually an overuse injury that causes swelling of the bursa (bursitis), swelling of the tendon (tendonitis), and/or a tear of the tendon (strain). Strains are classified into three categories. Grade 1 strains cause pain, but the tendon is not lengthened. Grade 2 strains include a lengthened ligament, due to the ligament being stretched or partially ruptured. With grade 2 strains there is still function, although the function may be decreased. Grade 3 strains include a complete tear of the tendon or muscle, and function is usually impaired. SYMPTOMS   Pain around the shoulder, often at the outer portion of the upper arm.  Pain that gets worse with shoulder function, especially when reaching overhead or lifting.  Sometimes, aching when not using the arm.  Pain that wakes you up at night.  Sometimes, tenderness, swelling, warmth, or redness over the affected area.  Loss of strength.  Limited motion of the shoulder, especially reaching behind the back (to the back pocket or to unhook bra) or across your body.  Crackling sound (crepitation) when moving the arm.  Biceps tendon pain and inflammation (in the front of the shoulder). Worse when bending the elbow or lifting. CAUSES  Impingement syndrome is often an overuse injury, in which chronic (repetitive) motions cause the tendons or bursa to  become inflamed. A strain occurs when a force is paced on the tendon or muscle that is greater than it can withstand. Common mechanisms of injury include: Stress from sudden increase in duration, frequency, or intensity of training.  Direct hit (trauma) to the shoulder.  Aging, erosion of the tendon with normal use.  Bony bump on shoulder (acromial spur). RISK INCREASES WITH:  Contact sports (football, wrestling, boxing).  Throwing sports (baseball, tennis, volleyball).  Weightlifting and bodybuilding.  Heavy labor.  Previous injury to the rotator cuff, including impingement.  Poor shoulder strength and flexibility.  Failure to warm up properly before activity.  Inadequate protective equipment.  Old age.  Bony bump on shoulder (acromial spur). PREVENTION   Warm up and stretch properly before activity.  Allow for adequate recovery between workouts.  Maintain physical fitness:  Strength, flexibility, and endurance.  Cardiovascular fitness.  Learn and use proper exercise technique. PROGNOSIS  If treated properly, impingement syndrome usually goes away within 6 weeks. Sometimes surgery is required.  RELATED COMPLICATIONS   Longer healing time if not properly treated, or if not given enough time to heal.  Recurring symptoms, that result in a chronic condition.  Shoulder stiffness, frozen shoulder, or loss of motion.  Rotator cuff tendon tear.  Recurring symptoms, especially if activity is resumed too soon, with overuse, with a direct blow, or when using poor technique. TREATMENT  Treatment first involves the use of ice and medicine, to reduce pain and inflammation. The use of strengthening and stretching exercises may help reduce pain with activity. These exercises may be performed at home or with a therapist. If non-surgical treatment is unsuccessful after more than 6  months, surgery may be advised. After surgery and rehabilitation, activity is usually possible in  3 months.  MEDICATION  If pain medicine is needed, nonsteroidal anti-inflammatory medicines (aspirin and ibuprofen), or other minor pain relievers (acetaminophen), are often advised.  Do not take pain medicine for 7 days before surgery.  Prescription pain relievers may be given, if your caregiver thinks they are needed. Use only as directed and only as much as you need.  Corticosteroid injections may be given by your caregiver. These injections should be reserved for the most serious cases, because they may only be given a certain number of times. HEAT AND COLD  Cold treatment (icing) should be applied for 10 to 15 minutes every 2 to 3 hours for inflammation and pain, and immediately after activity that aggravates your symptoms. Use ice packs or an ice massage.  Heat treatment may be used before performing stretching and strengthening activities prescribed by your caregiver, physical therapist, or athletic trainer. Use a heat pack or a warm water soak. SEEK MEDICAL CARE IF:   Symptoms get worse or do not improve in 4 to 6 weeks, despite treatment.  New, unexplained symptoms develop. (Drugs used in treatment may produce side effects.) EXERCISES  RANGE OF MOTION (ROM) AND STRETCHING EXERCISES - Impingement Syndrome (Rotator Cuff  Tendinitis, Bursitis) These exercises may help you when beginning to rehabilitate your injury. Your symptoms may go away with or without further involvement from your physician, physical therapist or athletic trainer. While completing these exercises, remember:   Restoring tissue flexibility helps normal motion to return to the joints. This allows healthier, less painful movement and activity.  An effective stretch should be held for at least 30 seconds.  A stretch should never be painful. You should only feel a gentle lengthening or release in the stretched tissue. STRETCH - Flexion, Standing  Stand with good posture. With an underhand grip on your right /  left hand, and an overhand grip on the opposite hand, grasp a broomstick or cane so that your hands are a little more than shoulder width apart.  Keeping your right / left elbow straight and shoulder muscles relaxed, push the stick with your opposite hand, to raise your right / left arm in front of your body and then overhead. Raise your arm until you feel a stretch in your right / left shoulder, but before you have increased shoulder pain.  Try to avoid shrugging your right / left shoulder as your arm rises, by keeping your shoulder blade tucked down and toward your mid-back spine. Hold for __________ seconds.  Slowly return to the starting position. Repeat __________ times. Complete this exercise __________ times per day. STRETCH - Abduction, Supine  Lie on your back. With an underhand grip on your right / left hand and an overhand grip on the opposite hand, grasp a broomstick or cane so that your hands are a little more than shoulder width apart.  Keeping your right / left elbow straight and your shoulder muscles relaxed, push the stick with your opposite hand, to raise your right / left arm out to the side of your body and then overhead. Raise your arm until you feel a stretch in your right / left shoulder, but before you have increased shoulder pain.  Try to avoid shrugging your right / left shoulder as your arm rises, by keeping your shoulder blade tucked down and toward your mid-back spine. Hold for __________ seconds.  Slowly return to the starting position. Repeat __________  times. Complete this exercise __________ times per day. ROM - Flexion, Active-Assisted  Lie on your back. You may bend your knees for comfort.  Grasp a broomstick or cane so your hands are about shoulder width apart. Your right / left hand should grip the end of the stick, so that your hand is positioned "thumbs-up," as if you were about to shake hands.  Using your healthy arm to lead, raise your right / left arm  overhead, until you feel a gentle stretch in your shoulder. Hold for __________ seconds.  Use the stick to assist in returning your right / left arm to its starting position. Repeat __________ times. Complete this exercise __________ times per day.  ROM - Internal Rotation, Supine   Lie on your back on a firm surface. Place your right / left elbow about 60 degrees away from your side. Elevate your elbow with a folded towel, so that the elbow and shoulder are the same height.  Using a broomstick or cane and your strong arm, pull your right / left hand toward your body until you feel a gentle stretch, but no increase in your shoulder pain. Keep your shoulder and elbow in place throughout the exercise.  Hold for __________ seconds. Slowly return to the starting position. Repeat __________ times. Complete this exercise __________ times per day. STRETCH - Internal Rotation  Place your right / left hand behind your back, palm up.  Throw a towel or belt over your opposite shoulder. Grasp the towel with your right / left hand.  While keeping an upright posture, gently pull up on the towel, until you feel a stretch in the front of your right / left shoulder.  Avoid shrugging your right / left shoulder as your arm rises, by keeping your shoulder blade tucked down and toward your mid-back spine.  Hold for __________ seconds. Release the stretch, by lowering your healthy hand. Repeat __________ times. Complete this exercise __________ times per day. ROM - Internal Rotation   Using an underhand grip, grasp a stick behind your back with both hands.  While standing upright with good posture, slide the stick up your back until you feel a mild stretch in the front of your shoulder.  Hold for __________ seconds. Slowly return to your starting position. Repeat __________ times. Complete this exercise __________ times per day.  STRETCH - Posterior Shoulder Capsule   Stand or sit with good posture.  Grasp your right / left elbow and draw it across your chest, keeping it at the same height as your shoulder.  Pull your elbow, so your upper arm comes in closer to your chest. Pull until you feel a gentle stretch in the back of your shoulder.  Hold for __________ seconds. Repeat __________ times. Complete this exercise __________ times per day. STRENGTHENING EXERCISES - Impingement Syndrome (Rotator Cuff Tendinitis, Bursitis) These exercises may help you when beginning to rehabilitate your injury. They may resolve your symptoms with or without further involvement from your physician, physical therapist or athletic trainer. While completing these exercises, remember:  Muscles can gain both the endurance and the strength needed for everyday activities through controlled exercises.  Complete these exercises as instructed by your physician, physical therapist or athletic trainer. Increase the resistance and repetitions only as guided.  You may experience muscle soreness or fatigue, but the pain or discomfort you are trying to eliminate should never worsen during these exercises. If this pain does get worse, stop and make sure you are following the  directions exactly. If the pain is still present after adjustments, discontinue the exercise until you can discuss the trouble with your clinician.  During your recovery, avoid activity or exercises which involve actions that place your injured hand or elbow above your head or behind your back or head. These positions stress the tissues which you are trying to heal. STRENGTH - Scapular Depression and Adduction   With good posture, sit on a firm chair. Support your arms in front of you, with pillows, arm rests, or on a table top. Have your elbows in line with the sides of your body.  Gently draw your shoulder blades down and toward your mid-back spine. Gradually increase the tension, without tensing the muscles along the top of your shoulders and the back of  your neck.  Hold for __________ seconds. Slowly release the tension and relax your muscles completely before starting the next repetition.  After you have practiced this exercise, remove the arm support and complete the exercise in standing as well as sitting position. Repeat __________ times. Complete this exercise __________ times per day.  STRENGTH - Shoulder Abductors, Isometric  With good posture, stand or sit about 4-6 inches from a wall, with your right / left side facing the wall.  Bend your right / left elbow. Gently press your right / left elbow into the wall. Increase the pressure gradually, until you are pressing as hard as you can, without shrugging your shoulder or increasing any shoulder discomfort.  Hold for __________ seconds.  Release the tension slowly. Relax your shoulder muscles completely before you begin the next repetition. Repeat __________ times. Complete this exercise __________ times per day.  STRENGTH - External Rotators, Isometric  Keep your right / left elbow at your side and bend it 90 degrees.  Step into a door frame so that the outside of your right / left wrist can press against the door frame without your upper arm leaving your side.  Gently press your right / left wrist into the door frame, as if you were trying to swing the back of your hand away from your stomach. Gradually increase the tension, until you are pressing as hard as you can, without shrugging your shoulder or increasing any shoulder discomfort.  Hold for __________ seconds.  Release the tension slowly. Relax your shoulder muscles completely before you begin the next repetition. Repeat __________ times. Complete this exercise __________ times per day.  STRENGTH - Supraspinatus   Stand or sit with good posture. Grasp a __________ weight, or an exercise band or tubing, so that your hand is "thumbs-up," like you are shaking hands.  Slowly lift your right / left arm in a "V" away from  your thigh, diagonally into the space between your side and straight ahead. Lift your hand to shoulder height or as far as you can, without increasing any shoulder pain. At first, many people do not lift their hands above shoulder height.  Avoid shrugging your right / left shoulder as your arm rises, by keeping your shoulder blade tucked down and toward your mid-back spine.  Hold for __________ seconds. Control the descent of your hand, as you slowly return to your starting position. Repeat __________ times. Complete this exercise __________ times per day.  STRENGTH - External Rotators  Secure a rubber exercise band or tubing to a fixed object (table, pole) so that it is at the same height as your right / left elbow when you are standing or sitting on a firm surface.  Stand or sit so that the secured exercise band is at your uninjured side.  Bend your right / left elbow 90 degrees. Place a folded towel or small pillow under your right / left arm, so that your elbow is a few inches away from your side.  Keeping the tension on the exercise band, pull it away from your body, as if pivoting on your elbow. Be sure to keep your body steady, so that the movement is coming only from your rotating shoulder.  Hold for __________ seconds. Release the tension in a controlled manner, as you return to the starting position. Repeat __________ times. Complete this exercise __________ times per day.  STRENGTH - Internal Rotators   Secure a rubber exercise band or tubing to a fixed object (table, pole) so that it is at the same height as your right / left elbow when you are standing or sitting on a firm surface.  Stand or sit so that the secured exercise band is at your right / left side.  Bend your elbow 90 degrees. Place a folded towel or small pillow under your right / left arm so that your elbow is a few inches away from your side.  Keeping the tension on the exercise band, pull it across your body,  toward your stomach. Be sure to keep your body steady, so that the movement is coming only from your rotating shoulder.  Hold for __________ seconds. Release the tension in a controlled manner, as you return to the starting position. Repeat __________ times. Complete this exercise __________ times per day.  STRENGTH - Scapular Protractors, Standing   Stand arms length away from a wall. Place your hands on the wall, keeping your elbows straight.  Begin by dropping your shoulder blades down and toward your mid-back spine.  To strengthen your protractors, keep your shoulder blades down, but slide them forward on your rib cage. It will feel as if you are lifting the back of your rib cage away from the wall. This is a subtle motion and can be challenging to complete. Ask your caregiver for further instruction, if you are not sure you are doing the exercise correctly.  Hold for __________ seconds. Slowly return to the starting position, resting the muscles completely before starting the next repetition. Repeat __________ times. Complete this exercise __________ times per day. STRENGTH - Scapular Protractors, Supine  Lie on your back on a firm surface. Extend your right / left arm straight into the air while holding a __________ weight in your hand.  Keeping your head and back in place, lift your shoulder off the floor.  Hold for __________ seconds. Slowly return to the starting position, and allow your muscles to relax completely before starting the next repetition. Repeat __________ times. Complete this exercise __________ times per day. STRENGTH - Scapular Protractors, Quadruped  Get onto your hands and knees, with your shoulders directly over your hands (or as close as you can be, comfortably).  Keeping your elbows locked, lift the back of your rib cage up into your shoulder blades, so your mid-back rounds out. Keep your neck muscles relaxed.  Hold this position for __________ seconds.  Slowly return to the starting position and allow your muscles to relax completely before starting the next repetition. Repeat __________ times. Complete this exercise __________ times per day.  STRENGTH - Scapular Retractors  Secure a rubber exercise band or tubing to a fixed object (table, pole), so that it is at the height of your  shoulders when you are either standing, or sitting on a firm armless chair.  With a palm down grip, grasp an end of the band in each hand. Straighten your elbows and lift your hands straight in front of you, at shoulder height. Step back, away from the secured end of the band, until it becomes tense.  Squeezing your shoulder blades together, draw your elbows back toward your sides, as you bend them. Keep your upper arms lifted away from your body throughout the exercise.  Hold for __________ seconds. Slowly ease the tension on the band, as you reverse the directions and return to the starting position. Repeat __________ times. Complete this exercise __________ times per day. STRENGTH - Shoulder Extensors   Secure a rubber exercise band or tubing to a fixed object (table, pole) so that it is at the height of your shoulders when you are either standing, or sitting on a firm armless chair.  With a thumbs-up grip, grasp an end of the band in each hand. Straighten your elbows and lift your hands straight in front of you, at shoulder height. Step back, away from the secured end of the band, until it becomes tense.  Squeezing your shoulder blades together, pull your hands down to the sides of your thighs. Do not allow your hands to go behind you.  Hold for __________ seconds. Slowly ease the tension on the band, as you reverse the directions and return to the starting position. Repeat __________ times. Complete this exercise __________ times per day.  STRENGTH - Scapular Retractors and External Rotators   Secure a rubber exercise band or tubing to a fixed object (table,  pole) so that it is at the height as your shoulders, when you are either standing, or sitting on a firm armless chair.  With a palm down grip, grasp an end of the band in each hand. Bend your elbows 90 degrees and lift your elbows to shoulder height, at your sides. Step back, away from the secured end of the band, until it becomes tense.  Squeezing your shoulder blades together, rotate your shoulders so that your upper arms and elbows remain stationary, but your fists travel upward to head height.  Hold for __________ seconds. Slowly ease the tension on the band, as you reverse the directions and return to the starting position. Repeat __________ times. Complete this exercise __________ times per day.  STRENGTH - Scapular Retractors and External Rotators, Rowing   Secure a rubber exercise band or tubing to a fixed object (table, pole) so that it is at the height of your shoulders, when you are either standing, or sitting on a firm armless chair.  With a palm down grip, grasp an end of the band in each hand. Straighten your elbows and lift your hands straight in front of you, at shoulder height. Step back, away from the secured end of the band, until it becomes tense.  Step 1: Squeeze your shoulder blades together. Bending your elbows, draw your hands to your chest, as if you are rowing a boat. At the end of this motion, your hands and elbow should be at shoulder height and your elbows should be out to your sides.  Step 2: Rotate your shoulders, to raise your hands above your head. Your forearms should be vertical and your upper arms should be horizontal.  Hold for __________ seconds. Slowly ease the tension on the band, as you reverse the directions and return to the starting position. Repeat __________ times. Complete  this exercise __________ times per day.  STRENGTH - Scapular Depressors  Find a sturdy chair without wheels, such as a dining room chair.  Keeping your feet on the floor, and  your hands on the chair arms, lift your bottom up from the seat, and lock your elbows.  Keeping your elbows straight, allow gravity to pull your body weight down. Your shoulders will rise toward your ears.  Raise your body against gravity by drawing your shoulder blades down your back, shortening the distance between your shoulders and ears. Although your feet should always maintain contact with the floor, your feet should progressively support less body weight, as you get stronger.  Hold for __________ seconds. In a controlled and slow manner, lower your body weight to begin the next repetition. Repeat __________ times. Complete this exercise __________ times per day.  Document Released: 07/30/2005 Document Revised: 10/22/2011 Document Reviewed: 11/11/2008 [facility] Patient Information 2015 [place], [place]. This information is not intended to replace advice given to you by your health care provider. Make sure you discuss any questions you have with your health care provider.     I personally performed the services described in this documentation, which was scribed in my presence. The recorded information has been reviewed and considered, and addended by me as needed.

## 2015-04-27 NOTE — Patient Instructions (Signed)
Your shoulder symptoms may be a combination of bursitis and rotator cuff tendinitis. Rarely this can be from quinolone antibiotics such as Levaquin or Cipro. As it is improving the past 2 days, no new medications needed, but avoid repetitive use of that arm, including shooting as recoil could proceed onto rupture of the tendon if it is significantly inflamed. See other information below on this condition. If it is not improving over the next few weeks, return to see me or I can have an orthopedist evaluate you as well.  As far as gout, we may need to decrease to just 100 mg a day or trial off of allopurinol for now if needed based on your kidney function tomorrow. Either one is an option, and this can be up to Dr. Patton Salles. If he has any questions please let me know. Since they are drawing blood tomorrow, it would be helpful to check another uric acid to see where your levels are. If they can't do that at their office, I'll be happy to put a lab only order in here.  If any return of urinary symptoms, abdominal pain, fever, or any other worsening symptoms, return to clinic or emergency room. Please let me know if you have any questions.  Impingement Syndrome, Rotator Cuff, Bursitis with Rehab Impingement syndrome is a condition that involves inflammation of the tendons of the rotator cuff and the subacromial bursa, that causes pain in the shoulder. The rotator cuff consists of four tendons and muscles that control much of the shoulder and upper arm function. The subacromial bursa is a fluid filled sac that helps reduce friction between the rotator cuff and one of the bones of the shoulder (acromion). Impingement syndrome is usually an overuse injury that causes swelling of the bursa (bursitis), swelling of the tendon (tendonitis), and/or a tear of the tendon (strain). Strains are classified into three categories. Grade 1 strains cause pain, but the tendon is not lengthened. Grade 2 strains include a lengthened  ligament, due to the ligament being stretched or partially ruptured. With grade 2 strains there is still function, although the function may be decreased. Grade 3 strains include a complete tear of the tendon or muscle, and function is usually impaired. SYMPTOMS   Pain around the shoulder, often at the outer portion of the upper arm.  Pain that gets worse with shoulder function, especially when reaching overhead or lifting.  Sometimes, aching when not using the arm.  Pain that wakes you up at night.  Sometimes, tenderness, swelling, warmth, or redness over the affected area.  Loss of strength.  Limited motion of the shoulder, especially reaching behind the back (to the back pocket or to unhook bra) or across your body.  Crackling sound (crepitation) when moving the arm.  Biceps tendon pain and inflammation (in the front of the shoulder). Worse when bending the elbow or lifting. CAUSES  Impingement syndrome is often an overuse injury, in which chronic (repetitive) motions cause the tendons or bursa to become inflamed. A strain occurs when a force is paced on the tendon or muscle that is greater than it can withstand. Common mechanisms of injury include: Stress from sudden increase in duration, frequency, or intensity of training.  Direct hit (trauma) to the shoulder.  Aging, erosion of the tendon with normal use.  Bony bump on shoulder (acromial spur). RISK INCREASES WITH:  Contact sports (football, wrestling, boxing).  Throwing sports (baseball, tennis, volleyball).  Weightlifting and bodybuilding.  Heavy labor.  Previous injury to  the rotator cuff, including impingement.  Poor shoulder strength and flexibility.  Failure to warm up properly before activity.  Inadequate protective equipment.  Old age.  Bony bump on shoulder (acromial spur). PREVENTION   Warm up and stretch properly before activity.  Allow for adequate recovery between workouts.  Maintain  physical fitness:  Strength, flexibility, and endurance.  Cardiovascular fitness.  Learn and use proper exercise technique. PROGNOSIS  If treated properly, impingement syndrome usually goes away within 6 weeks. Sometimes surgery is required.  RELATED COMPLICATIONS   Longer healing time if not properly treated, or if not given enough time to heal.  Recurring symptoms, that result in a chronic condition.  Shoulder stiffness, frozen shoulder, or loss of motion.  Rotator cuff tendon tear.  Recurring symptoms, especially if activity is resumed too soon, with overuse, with a direct blow, or when using poor technique. TREATMENT  Treatment first involves the use of ice and medicine, to reduce pain and inflammation. The use of strengthening and stretching exercises may help reduce pain with activity. These exercises may be performed at home or with a therapist. If non-surgical treatment is unsuccessful after more than 6 months, surgery may be advised. After surgery and rehabilitation, activity is usually possible in 3 months.  MEDICATION  If pain medicine is needed, nonsteroidal anti-inflammatory medicines (aspirin and ibuprofen), or other minor pain relievers (acetaminophen), are often advised.  Do not take pain medicine for 7 days before surgery.  Prescription pain relievers may be given, if your caregiver thinks they are needed. Use only as directed and only as much as you need.  Corticosteroid injections may be given by your caregiver. These injections should be reserved for the most serious cases, because they may only be given a certain number of times. HEAT AND COLD  Cold treatment (icing) should be applied for 10 to 15 minutes every 2 to 3 hours for inflammation and pain, and immediately after activity that aggravates your symptoms. Use ice packs or an ice massage.  Heat treatment may be used before performing stretching and strengthening activities prescribed by your caregiver,  physical therapist, or athletic trainer. Use a heat pack or a warm water soak. SEEK MEDICAL CARE IF:   Symptoms get worse or do not improve in 4 to 6 weeks, despite treatment.  New, unexplained symptoms develop. (Drugs used in treatment may produce side effects.) EXERCISES  RANGE OF MOTION (ROM) AND STRETCHING EXERCISES - Impingement Syndrome (Rotator Cuff  Tendinitis, Bursitis) These exercises may help you when beginning to rehabilitate your injury. Your symptoms may go away with or without further involvement from your physician, physical therapist or athletic trainer. While completing these exercises, remember:   Restoring tissue flexibility helps normal motion to return to the joints. This allows healthier, less painful movement and activity.  An effective stretch should be held for at least 30 seconds.  A stretch should never be painful. You should only feel a gentle lengthening or release in the stretched tissue. STRETCH - Flexion, Standing  Stand with good posture. With an underhand grip on your right / left hand, and an overhand grip on the opposite hand, grasp a broomstick or cane so that your hands are a little more than shoulder width apart.  Keeping your right / left elbow straight and shoulder muscles relaxed, push the stick with your opposite hand, to raise your right / left arm in front of your body and then overhead. Raise your arm until you feel a stretch in your  right / left shoulder, but before you have increased shoulder pain.  Try to avoid shrugging your right / left shoulder as your arm rises, by keeping your shoulder blade tucked down and toward your mid-back spine. Hold for __________ seconds.  Slowly return to the starting position. Repeat __________ times. Complete this exercise __________ times per day. STRETCH - Abduction, Supine  Lie on your back. With an underhand grip on your right / left hand and an overhand grip on the opposite hand, grasp a broomstick or  cane so that your hands are a little more than shoulder width apart.  Keeping your right / left elbow straight and your shoulder muscles relaxed, push the stick with your opposite hand, to raise your right / left arm out to the side of your body and then overhead. Raise your arm until you feel a stretch in your right / left shoulder, but before you have increased shoulder pain.  Try to avoid shrugging your right / left shoulder as your arm rises, by keeping your shoulder blade tucked down and toward your mid-back spine. Hold for __________ seconds.  Slowly return to the starting position. Repeat __________ times. Complete this exercise __________ times per day. ROM - Flexion, Active-Assisted  Lie on your back. You may bend your knees for comfort.  Grasp a broomstick or cane so your hands are about shoulder width apart. Your right / left hand should grip the end of the stick, so that your hand is positioned "thumbs-up," as if you were about to shake hands.  Using your healthy arm to lead, raise your right / left arm overhead, until you feel a gentle stretch in your shoulder. Hold for __________ seconds.  Use the stick to assist in returning your right / left arm to its starting position. Repeat __________ times. Complete this exercise __________ times per day.  ROM - Internal Rotation, Supine   Lie on your back on a firm surface. Place your right / left elbow about 60 degrees away from your side. Elevate your elbow with a folded towel, so that the elbow and shoulder are the same height.  Using a broomstick or cane and your strong arm, pull your right / left hand toward your body until you feel a gentle stretch, but no increase in your shoulder pain. Keep your shoulder and elbow in place throughout the exercise.  Hold for __________ seconds. Slowly return to the starting position. Repeat __________ times. Complete this exercise __________ times per day. STRETCH - Internal Rotation  Place  your right / left hand behind your back, palm up.  Throw a towel or belt over your opposite shoulder. Grasp the towel with your right / left hand.  While keeping an upright posture, gently pull up on the towel, until you feel a stretch in the front of your right / left shoulder.  Avoid shrugging your right / left shoulder as your arm rises, by keeping your shoulder blade tucked down and toward your mid-back spine.  Hold for __________ seconds. Release the stretch, by lowering your healthy hand. Repeat __________ times. Complete this exercise __________ times per day. ROM - Internal Rotation   Using an underhand grip, grasp a stick behind your back with both hands.  While standing upright with good posture, slide the stick up your back until you feel a mild stretch in the front of your shoulder.  Hold for __________ seconds. Slowly return to your starting position. Repeat __________ times. Complete this exercise __________ times  per day.  STRETCH - Posterior Shoulder Capsule   Stand or sit with good posture. Grasp your right / left elbow and draw it across your chest, keeping it at the same height as your shoulder.  Pull your elbow, so your upper arm comes in closer to your chest. Pull until you feel a gentle stretch in the back of your shoulder.  Hold for __________ seconds. Repeat __________ times. Complete this exercise __________ times per day. STRENGTHENING EXERCISES - Impingement Syndrome (Rotator Cuff Tendinitis, Bursitis) These exercises may help you when beginning to rehabilitate your injury. They may resolve your symptoms with or without further involvement from your physician, physical therapist or athletic trainer. While completing these exercises, remember:  Muscles can gain both the endurance and the strength needed for everyday activities through controlled exercises.  Complete these exercises as instructed by your physician, physical therapist or athletic trainer.  Increase the resistance and repetitions only as guided.  You may experience muscle soreness or fatigue, but the pain or discomfort you are trying to eliminate should never worsen during these exercises. If this pain does get worse, stop and make sure you are following the directions exactly. If the pain is still present after adjustments, discontinue the exercise until you can discuss the trouble with your clinician.  During your recovery, avoid activity or exercises which involve actions that place your injured hand or elbow above your head or behind your back or head. These positions stress the tissues which you are trying to heal. STRENGTH - Scapular Depression and Adduction   With good posture, sit on a firm chair. Support your arms in front of you, with pillows, arm rests, or on a table top. Have your elbows in line with the sides of your body.  Gently draw your shoulder blades down and toward your mid-back spine. Gradually increase the tension, without tensing the muscles along the top of your shoulders and the back of your neck.  Hold for __________ seconds. Slowly release the tension and relax your muscles completely before starting the next repetition.  After you have practiced this exercise, remove the arm support and complete the exercise in standing as well as sitting position. Repeat __________ times. Complete this exercise __________ times per day.  STRENGTH - Shoulder Abductors, Isometric  With good posture, stand or sit about 4-6 inches from a wall, with your right / left side facing the wall.  Bend your right / left elbow. Gently press your right / left elbow into the wall. Increase the pressure gradually, until you are pressing as hard as you can, without shrugging your shoulder or increasing any shoulder discomfort.  Hold for __________ seconds.  Release the tension slowly. Relax your shoulder muscles completely before you begin the next repetition. Repeat __________ times.  Complete this exercise __________ times per day.  STRENGTH - External Rotators, Isometric  Keep your right / left elbow at your side and bend it 90 degrees.  Step into a door frame so that the outside of your right / left wrist can press against the door frame without your upper arm leaving your side.  Gently press your right / left wrist into the door frame, as if you were trying to swing the back of your hand away from your stomach. Gradually increase the tension, until you are pressing as hard as you can, without shrugging your shoulder or increasing any shoulder discomfort.  Hold for __________ seconds.  Release the tension slowly. Relax your shoulder muscles completely  before you begin the next repetition. Repeat __________ times. Complete this exercise __________ times per day.  STRENGTH - Supraspinatus   Stand or sit with good posture. Grasp a __________ weight, or an exercise band or tubing, so that your hand is "thumbs-up," like you are shaking hands.  Slowly lift your right / left arm in a "V" away from your thigh, diagonally into the space between your side and straight ahead. Lift your hand to shoulder height or as far as you can, without increasing any shoulder pain. At first, many people do not lift their hands above shoulder height.  Avoid shrugging your right / left shoulder as your arm rises, by keeping your shoulder blade tucked down and toward your mid-back spine.  Hold for __________ seconds. Control the descent of your hand, as you slowly return to your starting position. Repeat __________ times. Complete this exercise __________ times per day.  STRENGTH - External Rotators  Secure a rubber exercise band or tubing to a fixed object (table, pole) so that it is at the same height as your right / left elbow when you are standing or sitting on a firm surface.  Stand or sit so that the secured exercise band is at your uninjured side.  Bend your right / left elbow 90  degrees. Place a folded towel or small pillow under your right / left arm, so that your elbow is a few inches away from your side.  Keeping the tension on the exercise band, pull it away from your body, as if pivoting on your elbow. Be sure to keep your body steady, so that the movement is coming only from your rotating shoulder.  Hold for __________ seconds. Release the tension in a controlled manner, as you return to the starting position. Repeat __________ times. Complete this exercise __________ times per day.  STRENGTH - Internal Rotators   Secure a rubber exercise band or tubing to a fixed object (table, pole) so that it is at the same height as your right / left elbow when you are standing or sitting on a firm surface.  Stand or sit so that the secured exercise band is at your right / left side.  Bend your elbow 90 degrees. Place a folded towel or small pillow under your right / left arm so that your elbow is a few inches away from your side.  Keeping the tension on the exercise band, pull it across your body, toward your stomach. Be sure to keep your body steady, so that the movement is coming only from your rotating shoulder.  Hold for __________ seconds. Release the tension in a controlled manner, as you return to the starting position. Repeat __________ times. Complete this exercise __________ times per day.  STRENGTH - Scapular Protractors, Standing   Stand arms length away from a wall. Place your hands on the wall, keeping your elbows straight.  Begin by dropping your shoulder blades down and toward your mid-back spine.  To strengthen your protractors, keep your shoulder blades down, but slide them forward on your rib cage. It will feel as if you are lifting the back of your rib cage away from the wall. This is a subtle motion and can be challenging to complete. Ask your caregiver for further instruction, if you are not sure you are doing the exercise correctly.  Hold for  __________ seconds. Slowly return to the starting position, resting the muscles completely before starting the next repetition. Repeat __________ times. Complete this exercise  __________ times per day. STRENGTH - Scapular Protractors, Supine  Lie on your back on a firm surface. Extend your right / left arm straight into the air while holding a __________ weight in your hand.  Keeping your head and back in place, lift your shoulder off the floor.  Hold for __________ seconds. Slowly return to the starting position, and allow your muscles to relax completely before starting the next repetition. Repeat __________ times. Complete this exercise __________ times per day. STRENGTH - Scapular Protractors, Quadruped  Get onto your hands and knees, with your shoulders directly over your hands (or as close as you can be, comfortably).  Keeping your elbows locked, lift the back of your rib cage up into your shoulder blades, so your mid-back rounds out. Keep your neck muscles relaxed.  Hold this position for __________ seconds. Slowly return to the starting position and allow your muscles to relax completely before starting the next repetition. Repeat __________ times. Complete this exercise __________ times per day.  STRENGTH - Scapular Retractors  Secure a rubber exercise band or tubing to a fixed object (table, pole), so that it is at the height of your shoulders when you are either standing, or sitting on a firm armless chair.  With a palm down grip, grasp an end of the band in each hand. Straighten your elbows and lift your hands straight in front of you, at shoulder height. Step back, away from the secured end of the band, until it becomes tense.  Squeezing your shoulder blades together, draw your elbows back toward your sides, as you bend them. Keep your upper arms lifted away from your body throughout the exercise.  Hold for __________ seconds. Slowly ease the tension on the band, as you reverse  the directions and return to the starting position. Repeat __________ times. Complete this exercise __________ times per day. STRENGTH - Shoulder Extensors   Secure a rubber exercise band or tubing to a fixed object (table, pole) so that it is at the height of your shoulders when you are either standing, or sitting on a firm armless chair.  With a thumbs-up grip, grasp an end of the band in each hand. Straighten your elbows and lift your hands straight in front of you, at shoulder height. Step back, away from the secured end of the band, until it becomes tense.  Squeezing your shoulder blades together, pull your hands down to the sides of your thighs. Do not allow your hands to go behind you.  Hold for __________ seconds. Slowly ease the tension on the band, as you reverse the directions and return to the starting position. Repeat __________ times. Complete this exercise __________ times per day.  STRENGTH - Scapular Retractors and External Rotators   Secure a rubber exercise band or tubing to a fixed object (table, pole) so that it is at the height as your shoulders, when you are either standing, or sitting on a firm armless chair.  With a palm down grip, grasp an end of the band in each hand. Bend your elbows 90 degrees and lift your elbows to shoulder height, at your sides. Step back, away from the secured end of the band, until it becomes tense.  Squeezing your shoulder blades together, rotate your shoulders so that your upper arms and elbows remain stationary, but your fists travel upward to head height.  Hold for __________ seconds. Slowly ease the tension on the band, as you reverse the directions and return to the starting position.  Repeat __________ times. Complete this exercise __________ times per day.  STRENGTH - Scapular Retractors and External Rotators, Rowing   Secure a rubber exercise band or tubing to a fixed object (table, pole) so that it is at the height of your shoulders,  when you are either standing, or sitting on a firm armless chair.  With a palm down grip, grasp an end of the band in each hand. Straighten your elbows and lift your hands straight in front of you, at shoulder height. Step back, away from the secured end of the band, until it becomes tense.  Step 1: Squeeze your shoulder blades together. Bending your elbows, draw your hands to your chest, as if you are rowing a boat. At the end of this motion, your hands and elbow should be at shoulder height and your elbows should be out to your sides.  Step 2: Rotate your shoulders, to raise your hands above your head. Your forearms should be vertical and your upper arms should be horizontal.  Hold for __________ seconds. Slowly ease the tension on the band, as you reverse the directions and return to the starting position. Repeat __________ times. Complete this exercise __________ times per day.  STRENGTH - Scapular Depressors  Find a sturdy chair without wheels, such as a dining room chair.  Keeping your feet on the floor, and your hands on the chair arms, lift your bottom up from the seat, and lock your elbows.  Keeping your elbows straight, allow gravity to pull your body weight down. Your shoulders will rise toward your ears.  Raise your body against gravity by drawing your shoulder blades down your back, shortening the distance between your shoulders and ears. Although your feet should always maintain contact with the floor, your feet should progressively support less body weight, as you get stronger.  Hold for __________ seconds. In a controlled and slow manner, lower your body weight to begin the next repetition. Repeat __________ times. Complete this exercise __________ times per day.  Document Released: 07/30/2005 Document Revised: 10/22/2011 Document Reviewed: 11/11/2008 [facility] Patient Information 2015 [place], [place]. This information is not intended to replace advice given to you by your  health care provider. Make sure you discuss any questions you have with your health care provider.

## 2015-05-02 ENCOUNTER — Telehealth: Payer: Self-pay

## 2015-05-02 DIAGNOSIS — M25511 Pain in right shoulder: Secondary | ICD-10-CM

## 2015-05-02 NOTE — Telephone Encounter (Signed)
Dr. Carlota Raspberry, ok for referral?

## 2015-05-02 NOTE — Telephone Encounter (Signed)
The patient would like a referral to an orthopedic surgeon.  She said she saw Dr. Carlota Raspberry on 04/27/15 for right arm pain and was told to request a referral if it was worse.  Please advise.  Thank you.  CB#: [phone]

## 2015-05-04 NOTE — Telephone Encounter (Signed)
Spoke with Pt. She schedule her own appt. For ortho. Because she was in too much pain and couldn't wait for the referral. Her appointment is today at [facility].

## 2015-05-04 NOTE — Telephone Encounter (Signed)
Referral placed.

## 2015-05-25 ENCOUNTER — Ambulatory Visit (INDEPENDENT_AMBULATORY_CARE_PROVIDER_SITE_OTHER): Payer: BLUE CROSS/BLUE SHIELD | Admitting: Physician Assistant

## 2015-05-25 VITALS — BP 146/92 | HR 115 | Temp 98.3°F | Resp 18 | Ht 64.0 in | Wt 204.0 lb

## 2015-05-25 DIAGNOSIS — M79671 Pain in right foot: Secondary | ICD-10-CM | POA: Diagnosis not present

## 2015-05-25 DIAGNOSIS — M1009 Idiopathic gout, multiple sites: Secondary | ICD-10-CM

## 2015-05-25 DIAGNOSIS — IMO0001 Reserved for inherently not codable concepts without codable children: Secondary | ICD-10-CM

## 2015-05-25 DIAGNOSIS — R030 Elevated blood-pressure reading, without diagnosis of hypertension: Secondary | ICD-10-CM | POA: Diagnosis not present

## 2015-05-25 DIAGNOSIS — M109 Gout, unspecified: Secondary | ICD-10-CM

## 2015-05-25 MED ORDER — HYDROCODONE-ACETAMINOPHEN 5-325 MG PO TABS: 1.0000 | ORAL_TABLET | Freq: Four times a day (QID) | ORAL | Status: DC | PRN

## 2015-05-25 NOTE — Progress Notes (Signed)
[facility] [address], [place] [address] [phone]  Date:  05/25/2015   Name:  April Banks   DOB:  07-Sep-1955   MRN:  [id number]  PCP:  Wendie Agreste, MD    Chief Complaint: Foot Pain   History of Present Illness:  This is a 60 y.o. female with PMH HTN, gout, IgA nephropathy, MVP, HLD who is presenting with right foot pain since this morning. Located to top of foot and described as constant radiating. No known injury. She is unable to put weight on that foot. She has not tried anything for symptoms yet. She has had recent bouts of gout in her left MTP. First dx with gout 02/2015. Uric acid at that time 11.1. She has been started on allopurinol 200 mg QD for gout and last had uric acid checked 04/29/15 at nephrologist office and was 6.2. She has colchicine at home but has not yet taken any. She has eliminated seafood. She does not drink alcohol. She eats chicken and eats gamey meat once a week (elk, moose). She is a Retail banker. She is on lasix for her kidney disease and has had recent kidney function decline.  She denies calf pain, fever, chills.    Review of Systems:  Review of Systems See HPI  Prior to Admission medications   Medication Sig Start Date End Date Taking? Authorizing Provider  acetaminophen (TYLENOL) 500 MG tablet Take 500 mg by mouth every 6 (six) hours as needed.    Historical Provider, MD  acyclovir (ZOVIRAX) 200 MG capsule TAKE 1 CAPSULE DAILY.  "NO MORE REFILLS WITHOUT OV" 04/12/15   Chelle Jeffery, PA-C  allopurinol (ZYLOPRIM) 100 MG tablet Take 1 tablet (100 mg total) by mouth daily. Increase to 3 pills daily over one week 03/18/15   Darreld Mclean, MD  aspirin 81 MG tablet Take 81 mg by mouth daily.      Historical Provider, MD  diltiazem (CARTIA XT) 180 MG 24 hr capsule Take 1 capsule (180 mg total) by mouth daily. 03/15/15   Wendie Agreste, MD  fish oil-omega-3 fatty acids 1000 MG capsule Take 2 g by mouth 3 (three) times daily.       Historical Provider, MD  furosemide (LASIX) 20 MG tablet Take 3 tablets (60 mg total) by mouth daily. 03/15/15   Wendie Agreste, MD  hydrALAZINE (APRESOLINE) 25 MG tablet Take 25 mg by mouth 3 (three) times daily.    Historical Provider, MD         potassium citrate (UROCIT-K) 10 MEQ (1080 MG) SR tablet TAKE 1 TABLET DAILY BEFORE BREAKFAST 02/28/15   Wendie Agreste, MD  rosuvastatin (CRESTOR) 10 MG tablet Take 10 mg by mouth daily.    Historical Provider, MD         valsartan (DIOVAN) 80 MG tablet TAKE 1 TABLET DAILY Patient taking differently: TAKE 1/2 TABLET DAILY 09/13/14   Wendie Agreste, MD    Patient Active Problem List   Diagnosis Date Noted  . Gout 03/18/2015  . MVP (mitral valve prolapse) 08/25/2011  . Herpes genitalis in women 08/25/2011  . Other and unspecified hyperlipidemia 09/04/2010  . OBESITY, UNSPECIFIED 09/04/2010  . ESSENTIAL HYPERTENSION, BENIGN 09/04/2010  . IGA NEPHROPATHY 07/31/2010   .  Allergies  Allergen Reactions  . Gantrisin [Sulfisoxazole] Shortness Of Breath and Nausea Only  . Sulfa Antibiotics Shortness Of Breath and Rash  . Ceclor [Cefaclor] Rash  . Macrodantin Nausea And Vomiting  .  Levaquin [Levofloxacin In D5w] Other (See Comments)    Arm pain  . Lisinopril Cough  . Pyridium [Phenazopyridine Hcl]   . Pyridium [Phenazopyridine Hcl] Nausea Only    Past Surgical History  Procedure Laterality Date  . Abdominal hysterectomy    . Arthroplasty w/ arthroscopy medial / lateral compartment knee    . Renal biopsy  2010  . Breast enhancement surgery  1990  . Breast surgery    . Eye surgery    . Tubal ligation      Social History  Substance Use Topics  . Smoking status: Never Smoker   . Smokeless tobacco: Never Used  . Alcohol Use: 1.2 oz/week    2 Standard drinks or equivalent per week     Comment: rare occasion    Family History  Problem Relation Age of Onset  . Heart disease Father   . Skin cancer Father   . Cancer Father   .  Hyperlipidemia Father   . Hypertension Father   . Colon cancer Maternal Grandmother   . Breast cancer Maternal Aunt   . Hyperlipidemia Mother     Medication list has been reviewed and updated.  Physical Examination:  Physical Exam  Constitutional: She is oriented to person, place, and time. She appears well-developed and well-nourished. No distress.  HENT:  Head: Normocephalic and atraumatic.  Right Ear: Hearing normal.  Left Ear: Hearing normal.  Nose: Nose normal.  Eyes: Conjunctivae and lids are normal. Right eye exhibits no discharge. Left eye exhibits no discharge. No scleral icterus.  Cardiovascular: Normal rate, regular rhythm, normal heart sounds and normal pulses.   No murmur heard. Pulmonary/Chest: Effort normal and breath sounds normal. No respiratory distress. She has no wheezes. She has no rhonchi. She has no rales.  Musculoskeletal:       Right ankle: Normal.       Left ankle: Normal.       Right lower leg: Normal.       Left lower leg: Normal.       Right foot: There is decreased range of motion, tenderness, bony tenderness and swelling.       Left foot: Normal.  No calf tenderness, bilateral calves measuring 43 cm Dorsal left foot tender to light touch. Significant tenderness with deeper touch Dorsal left foot with swelling and mild erythema.   Neurological: She is alert and oriented to person, place, and time.  Skin: Skin is warm, dry and intact.  Psychiatric: Her speech is normal and behavior is normal. Thought content normal.  Tearful during visit from pain   BP 146/92 mmHg  Pulse 115  Temp(Src) 98.3 F (36.8 C) (Oral)  Resp 18  Ht $R'5\' 4"'Hl$  (1.626 m)  Wt 204 lb (92.534 kg)  BMI 35.00 kg/m2  SpO2 98%  Assessment and Plan:  1. Gouty arthritis 2. Foot pain, right 3. Elevated BP Etiology of right foot pain likely gout as evidenced by foot swelling, erythema, pain with light touch and several recent bouts with gout. Increased gouty attacks likely from  worsening kidney function and lasix. Diet is good. Advised she limit the gamey meat she eats but she only eats once a week so probably not causing her sx. Elevated BP likely from pain. Continue allopurinol. She will take 2 tab colchicine when she gets home and repeat 1 tab in 1 hour. Norco for pain. Return in 5 days if symptoms not improved for further eval. - HYDROcodone-acetaminophen (NORCO/VICODIN) 5-325 MG tablet; Take 1 tablet by mouth  every 6 (six) hours as needed for moderate pain.  Dispense: 30 tablet; Refill: 0   Benjaman Pott. Drenda Freeze, MHS [facility]  05/25/2015

## 2015-05-25 NOTE — Patient Instructions (Addendum)
Take 2 tabs colchicine when you get home. Repeat in 1 hour. Take norco as needed for pain. Stick to lean meats - stay away from red meat if possible. In the future take colchicine at start of these symptoms. Let me know if symptoms have not improved in 1 week -- you will need to come back in for further eval if that's the case.  Gout Gout is an inflammatory arthritis caused by a buildup of uric acid crystals in the joints. Uric acid is a chemical that is normally present in the blood. When the level of uric acid in the blood is too high it can form crystals that deposit in your joints and tissues. This causes joint redness, soreness, and swelling (inflammation). Repeat attacks are common. Over time, uric acid crystals can form into masses (tophi) near a joint, destroying bone and causing disfigurement. Gout is treatable and often preventable. CAUSES  The disease begins with elevated levels of uric acid in the blood. Uric acid is produced by your body when it breaks down a naturally found substance called purines. Certain foods you eat, such as meats and fish, contain high amounts of purines. Causes of an elevated uric acid level include:  Being passed down from parent to child (heredity).  Diseases that cause increased uric acid production (such as obesity, psoriasis, and certain cancers).  Excessive alcohol use.  Diet, especially diets rich in meat and seafood.  Medicines, including certain cancer-fighting medicines (chemotherapy), water pills (diuretics), and aspirin.  Chronic kidney disease. The kidneys are no longer able to remove uric acid well.  Problems with metabolism. Conditions strongly associated with gout include:  Obesity.  High blood pressure.  High cholesterol.  Diabetes. Not everyone with elevated uric acid levels gets gout. It is not understood why some people get gout and others do not. Surgery, joint injury, and eating too much of certain foods are some of the  factors that can lead to gout attacks. SYMPTOMS   An attack of gout comes on quickly. It causes intense pain with redness, swelling, and warmth in a joint.  Fever can occur.  Often, only one joint is involved. Certain joints are more commonly involved:  Base of the big toe.  Knee.  Ankle.  Wrist.  Finger. Without treatment, an attack usually goes away in a few days to weeks. Between attacks, you usually will not have symptoms, which is different from many other forms of arthritis. DIAGNOSIS  Your caregiver will suspect gout based on your symptoms and exam. In some cases, tests may be recommended. The tests may include:  Blood tests.  Urine tests.  X-rays.  Joint fluid exam. This exam requires a needle to remove fluid from the joint (arthrocentesis). Using a microscope, gout is confirmed when uric acid crystals are seen in the joint fluid. TREATMENT  There are two phases to gout treatment: treating the sudden onset (acute) attack and preventing attacks (prophylaxis).  Treatment of an Acute Attack.  Medicines are used. These include anti-inflammatory medicines or steroid medicines.  An injection of steroid medicine into the affected joint is sometimes necessary.  The painful joint is rested. Movement can worsen the arthritis.  You may use warm or cold treatments on painful joints, depending which works best for you.  Treatment to Prevent Attacks.  If you suffer from frequent gout attacks, your caregiver may advise preventive medicine. These medicines are started after the acute attack subsides. These medicines either help your kidneys eliminate uric acid from your  body or decrease your uric acid production. You may need to stay on these medicines for a very long time.  The early phase of treatment with preventive medicine can be associated with an increase in acute gout attacks. For this reason, during the first few months of treatment, your caregiver may also advise you  to take medicines usually used for acute gout treatment. Be sure you understand your caregiver's directions. Your caregiver may make several adjustments to your medicine dose before these medicines are effective.  Discuss dietary treatment with your caregiver or dietitian. Alcohol and drinks high in sugar and fructose and foods such as meat, poultry, and seafood can increase uric acid levels. Your caregiver or dietitian can advise you on drinks and foods that should be limited. HOME CARE INSTRUCTIONS   Do not take aspirin to relieve pain. This raises uric acid levels.  Only take over-the-counter or prescription medicines for pain, discomfort, or fever as directed by your caregiver.  Rest the joint as much as possible. When in bed, keep sheets and blankets off painful areas.  Keep the affected joint raised (elevated).  Apply warm or cold treatments to painful joints. Use of warm or cold treatments depends on which works best for you.  Use crutches if the painful joint is in your leg.  Drink enough fluids to keep your urine clear or pale yellow. This helps your body get rid of uric acid. Limit alcohol, sugary drinks, and fructose drinks.  Follow your dietary instructions. Pay careful attention to the amount of protein you eat. Your daily diet should emphasize fruits, vegetables, whole grains, and fat-free or low-fat milk products. Discuss the use of coffee, vitamin C, and cherries with your caregiver or dietitian. These may be helpful in lowering uric acid levels.  Maintain a healthy body weight. SEEK MEDICAL CARE IF:   You develop diarrhea, vomiting, or any side effects from medicines.  You do not feel better in 24 hours, or you are getting worse. SEEK IMMEDIATE MEDICAL CARE IF:   Your joint becomes suddenly more tender, and you have chills or a fever. MAKE SURE YOU:   Understand these instructions.  Will watch your condition.  Will get help right away if you are not doing well or  get worse.   This information is not intended to replace advice given to you by your health care provider. Make sure you discuss any questions you have with your health care provider.   Document Released: 07/27/2000 Document Revised: 08/20/2014 Document Reviewed: 03/12/2012 Elsevier Interactive Patient Education [employer].

## 2015-06-05 ENCOUNTER — Encounter: Payer: Self-pay | Admitting: Radiology

## 2015-06-05 DIAGNOSIS — N183 Chronic kidney disease, stage 3 unspecified: Secondary | ICD-10-CM | POA: Insufficient documentation

## 2015-06-05 DIAGNOSIS — M1000 Idiopathic gout, unspecified site: Secondary | ICD-10-CM

## 2015-07-14 ENCOUNTER — Encounter: Payer: Self-pay | Admitting: *Deleted

## 2015-07-14 DIAGNOSIS — M7581 Other shoulder lesions, right shoulder: Secondary | ICD-10-CM

## 2015-08-11 ENCOUNTER — Other Ambulatory Visit: Payer: Self-pay | Admitting: Physician Assistant

## 2015-09-21 ENCOUNTER — Other Ambulatory Visit: Payer: Self-pay

## 2015-09-21 DIAGNOSIS — Z1231 Encounter for screening mammogram for malignant neoplasm of breast: Secondary | ICD-10-CM

## 2015-10-19 ENCOUNTER — Encounter: Payer: Self-pay | Admitting: Family Medicine

## 2015-10-19 ENCOUNTER — Ambulatory Visit (INDEPENDENT_AMBULATORY_CARE_PROVIDER_SITE_OTHER): Payer: BLUE CROSS/BLUE SHIELD | Admitting: Family Medicine

## 2015-10-19 VITALS — BP 134/76 | HR 83 | Temp 98.0°F | Resp 16 | Wt 210.0 lb

## 2015-10-19 DIAGNOSIS — Z8619 Personal history of other infectious and parasitic diseases: Secondary | ICD-10-CM | POA: Diagnosis not present

## 2015-10-19 DIAGNOSIS — M109 Gout, unspecified: Secondary | ICD-10-CM

## 2015-10-19 DIAGNOSIS — E785 Hyperlipidemia, unspecified: Secondary | ICD-10-CM | POA: Diagnosis not present

## 2015-10-19 DIAGNOSIS — M10072 Idiopathic gout, left ankle and foot: Secondary | ICD-10-CM

## 2015-10-19 MED ORDER — COLCHICINE 0.6 MG PO TABS
1.2000 mg | ORAL_TABLET | Freq: Once | ORAL | Status: AC | PRN
Start: 2015-10-19 — End: ?

## 2015-10-19 MED ORDER — ACYCLOVIR 200 MG PO CAPS: ORAL_CAPSULE | ORAL | Status: DC

## 2015-10-19 MED ORDER — ALLOPURINOL 100 MG PO TABS: 200.0000 mg | ORAL_TABLET | Freq: Every day | ORAL | Status: DC

## 2015-10-19 MED ORDER — ROSUVASTATIN CALCIUM 10 MG PO TABS: 10.0000 mg | ORAL_TABLET | Freq: Every day | ORAL | Status: DC

## 2015-10-19 NOTE — Progress Notes (Signed)
Subjective:    Patient ID: April Banks, female    DOB: 06-20-1956, 60 y.o.   MRN: [id number] By signing my name below, I, Zola Button, attest that this documentation has been prepared under the direction and in the presence of [name], MD.  Electronically Signed: Zola Button, Medical Scribe. 10/19/2015. 1:35 PM.  HPI HPI Comments: April Banks is a 60 y.o. female with a history of hyperlipidemia, hypertension, gout, and herpes genitalis who [facility]. Patient is not fasting today.  Hyperlipidemia: Takes Crestor 10 mg qd. Patient denies any side effects, including myalgias. She has not had recent lipid testing. Lab Results  Component Value Date   CHOL 205* 07/05/2014   HDL 68 07/05/2014   LDLCALC 117* 07/05/2014   TRIG 102 07/05/2014   CHOLHDL 3.0 07/05/2014    Lab Results  Component Value Date   ALT 13 07/05/2014   AST 13 07/05/2014   ALKPHOS 86 07/05/2014   BILITOT 0.3 07/05/2014    IgA nephropathy: She has a history of IgA nephropathy followed by nephrologist, Dr. Joseph Berkshire at [facility]. Creatinine range 1.2-1.7. She will be seeing her nephrologist tomorrow.  Gout: Takes allopurinol. Tried to increase dose, but had to decrease dose to 100 mg due to elevated creatinine. She was seen by Bennett Scrape, PA-C October 12th. Last uric acid in our system was 11.1 in July. Reportedly 6.2 at nephrology in September. She also has Colcrys as needed for gout. She has not had any gout flare-ups since October. Patient did see rheumatology at [facility] 08/12/15. She had been sent by Dr. Joseph Berkshire to rule out pseudogout. No medications were changed then, but they wanted her to see them next time she has a flare-up. Patient believes seafood is the main trigger for flare-ups.  History of genital HSV: Takes acyclovir for prevention. Patient has not had a flare-up in about 20 years.  Patient recently returned from the [place]. She will be  going to [place], [place] next week, then to [place] the week after.  Patient Active Problem List   Diagnosis Date Noted  . Right rotator cuff tendinitis 07/14/2015  . Chronic kidney disease (CKD), stage III (moderate) 06/05/2015  . Gouty arthritis 05/25/2015  . Gout 03/18/2015  . MVP (mitral valve prolapse) 08/25/2011  . Herpes genitalis in women 08/25/2011  . Other and unspecified hyperlipidemia 09/04/2010  . OBESITY, UNSPECIFIED 09/04/2010  . ESSENTIAL HYPERTENSION, BENIGN 09/04/2010  . IGA NEPHROPATHY 07/31/2010   Past Medical History  Diagnosis Date  . Renal abscess   . Basal cell carcinoma of leg   . Nephrolithiasis   . Anemia   . Heart murmur   . Hyperlipidemia   . Hypertension   . Anxiety   . Depression   . Fat necrosis (segmental) of breast 02/15/2012   Past Surgical History  Procedure Laterality Date  . Abdominal hysterectomy    . Arthroplasty w/ arthroscopy medial / lateral compartment knee    . Renal biopsy  2010  . Breast enhancement surgery  1990  . Breast surgery    . Eye surgery    . Tubal ligation     Allergies  Allergen Reactions  . Gantrisin [Sulfisoxazole] Shortness Of Breath and Nausea Only  . Sulfa Antibiotics Shortness Of Breath and Rash  . Ceclor [Cefaclor] Rash  . Macrodantin Nausea And Vomiting  . Tramadol Nausea And Vomiting  . Levaquin [Levofloxacin In D5w] Other (See Comments)  Arm pain  . Lisinopril Cough  . Pyridium [Phenazopyridine Hcl]   . Pyridium [Phenazopyridine Hcl] Nausea Only   Prior to Admission medications   Medication Sig Start Date End Date Taking? Authorizing Provider  acetaminophen (TYLENOL) 500 MG tablet Take 500 mg by mouth every 6 (six) hours as needed.   Yes Historical Provider, MD  acyclovir (ZOVIRAX) 200 MG capsule TAKE 1 CAPSULE DAILY (NO MORE REFILLS WITHOUT OFFICE VISIT) 08/12/15  Yes Chelle Jeffery, PA-C  allopurinol (ZYLOPRIM) 100 MG tablet Take 1 tablet (100 mg total) by mouth daily. Increase to 3 pills  daily over one week 03/18/15  Yes Gay Filler Copland, MD  aspirin 81 MG tablet Take 81 mg by mouth daily.     Yes Historical Provider, MD  diltiazem (CARTIA XT) 180 MG 24 hr capsule Take 1 capsule (180 mg total) by mouth daily. 03/15/15  Yes Wendie Agreste, MD  fish oil-omega-3 fatty acids 1000 MG capsule Take 2 g by mouth 3 (three) times daily.     Yes Historical Provider, MD  furosemide (LASIX) 20 MG tablet Take 3 tablets (60 mg total) by mouth daily. 03/15/15  Yes Wendie Agreste, MD  hydrALAZINE (APRESOLINE) 25 MG tablet Take 25 mg by mouth 3 (three) times daily.   Yes Historical Provider, MD  potassium citrate (UROCIT-K) 10 MEQ (1080 MG) SR tablet TAKE 1 TABLET DAILY BEFORE BREAKFAST 02/28/15  Yes Wendie Agreste, MD  rosuvastatin (CRESTOR) 10 MG tablet Take 10 mg by mouth daily.   Yes Historical Provider, MD  valsartan (DIOVAN) 80 MG tablet TAKE 1 TABLET DAILY Patient taking differently: TAKE 1/2 TABLET DAILY 09/13/14  Yes Wendie Agreste, MD  HYDROcodone-acetaminophen (NORCO/VICODIN) 5-325 MG tablet Take 1 tablet by mouth every 6 (six) hours as needed for moderate pain. Patient not taking: Reported on 10/19/2015 05/25/15   Ezekiel Slocumb, PA-C  traMADol (ULTRAM) 50 MG tablet Take 1 tablet (50 mg total) by mouth every 6 (six) hours as needed. Patient not taking: Reported on 04/27/2015 04/19/15   Roselee Culver, MD   Social History   Social History  . Marital Status: Divorced    Spouse Name: N/A  . Number of Children: N/A  . Years of Education: N/A   Occupational History  . Not on file.   Social History Main Topics  . Smoking status: Never Smoker   . Smokeless tobacco: Never Used  . Alcohol Use: 1.2 oz/week    2 Standard drinks or equivalent per week     Comment: rare occasion  . Drug Use: No  . Sexual Activity: Not on file   Other Topics Concern  . Not on file   Social History Narrative   Divorced   Engineer, structural:  [employer]   Exercises     Review of Systems    Musculoskeletal: Negative for myalgias.  Skin: Negative for rash.       Objective:   Physical Exam  Constitutional: She is oriented to person, place, and time. She appears well-developed and well-nourished.  HENT:  Head: Normocephalic and atraumatic.  Eyes: Conjunctivae and EOM are normal. Pupils are equal, round, and reactive to light.  Neck: Carotid bruit is not present.  Cardiovascular: Normal rate, regular rhythm and intact distal pulses.   Murmur heard.  Systolic murmur is present  1-6/1 systolic murmur.  Pulmonary/Chest: Effort normal and breath sounds normal.  Abdominal: Soft. She exhibits no pulsatile midline mass. There is no tenderness.  Musculoskeletal: She  exhibits no edema.  No lower extremity edema.  Neurological: She is alert and oriented to person, place, and time.  Skin: Skin is warm and dry.  Psychiatric: She has a normal mood and affect. Her behavior is normal.  Vitals reviewed.   Filed Vitals:   10/19/15 1302  BP: 134/76  Pulse: 83  Temp: 98 F (36.7 C)  TempSrc: Oral  Resp: 16  Weight: 210 lb (95.255 kg)         Assessment & Plan:   April Banks is a 61 y.o. female Gouty arthritis of toe of left foot - Plan: allopurinol (ZYLOPRIM) 100 MG tablet, colchicine 0.6 MG tablet  -Recently stable. Seafood appears to be the trigger. Tolerating allopurinol at current dose of 200 mg daily. Planning on blood work with her nephrologist in 1 day, can check creatinine at that time. Advised to discuss use of colchicine with her nephrologist, and only to use during acute flare. If she does use colchicine, should not take Crestor at same time. Plan for follow-up with the rheumatologist during acute flare for aspiration of joint to rule out pseudogout.  Hyperlipidemia - Plan: Lipid panel, Hepatic Function Panel, rosuvastatin (CRESTOR) 10 MG tablet  -Lab only order placed if not able to obtain at her upcoming nephrology appointment. Continue Crestor 10 mg daily for  now.  History of herpes genitalis - Plan: acyclovir (ZOVIRAX) 200 MG capsule  -Refilled acyclovir. Asymptomatic, no recent flares.  Meds ordered this encounter  Medications  . allopurinol (ZYLOPRIM) 100 MG tablet    Sig: Take 2 tablets (200 mg total) by mouth daily. I    Dispense:  180 tablet    Refill:  1  . rosuvastatin (CRESTOR) 10 MG tablet    Sig: Take 1 tablet (10 mg total) by mouth daily.    Dispense:  90 tablet    Refill:  1  . acyclovir (ZOVIRAX) 200 MG capsule    Sig: TAKE 1 CAPSULE DAILY    Dispense:  90 capsule    Refill:  3  . colchicine 0.6 MG tablet    Sig: Take 2 tablets (1.2 mg total) by mouth once as needed. May take 1 tablet 1 hour later if needed.    Dispense:  10 tablet    Refill:  1   Patient Instructions  As discussed in past - avoid combo of Crestor and colchicine if possible. Lowest effective dose of colchicine IF needed and make sure your nephrologist is still ok with you taking this for gout flares. Keep follow up with rheumatologist.   If cholesterol not checked tomorrow - can return for fasting lab visit only.  Follow up in 6 months.      I personally performed the services described in this documentation, which was scribed in my presence. The recorded information has been reviewed and considered, and addended by me as needed.

## 2015-10-19 NOTE — Patient Instructions (Signed)
As discussed in past - avoid combo of Crestor and colchicine if possible. Lowest effective dose of colchicine IF needed and make sure your nephrologist is still ok with you taking this for gout flares. Keep follow up with rheumatologist.   If cholesterol not checked tomorrow - can return for fasting lab visit only.  Follow up in 6 months.

## 2015-10-20 LAB — LIPID PANEL
HDL: 68 mg/dL (ref 35–70)
LDL Cholesterol: 101 mg/dL
Triglycerides: 132 mg/dL (ref 40–160)

## 2015-10-21 ENCOUNTER — Ambulatory Visit
Admission: RE | Admit: 2015-10-21 | Discharge: 2015-10-21 | Disposition: A | Discharge status: Home / Self Care | Payer: BLUE CROSS/BLUE SHIELD | Source: Ambulatory Visit

## 2015-10-21 DIAGNOSIS — Z1231 Encounter for screening mammogram for malignant neoplasm of breast: Secondary | ICD-10-CM

## 2015-11-22 ENCOUNTER — Other Ambulatory Visit: Payer: Self-pay | Admitting: Family Medicine

## 2015-12-26 ENCOUNTER — Encounter: Payer: Self-pay | Admitting: *Deleted

## 2016-04-10 ENCOUNTER — Other Ambulatory Visit: Payer: Self-pay | Admitting: Family Medicine

## 2016-04-10 DIAGNOSIS — M109 Gout, unspecified: Secondary | ICD-10-CM

## 2016-04-20 ENCOUNTER — Telehealth: Payer: Self-pay

## 2016-04-20 NOTE — Telephone Encounter (Signed)
Pharmacy called to confirm that last months refill on allopurinol was correct that two rx's were sent in and they filled the first request  The reference number is [id number]   Pharmacy number is [phone]

## 2016-04-24 ENCOUNTER — Other Ambulatory Visit: Payer: Self-pay

## 2016-04-24 NOTE — Telephone Encounter (Signed)
Called [facility] and they reported that they had another Rx sent in before Dr Vonna Kotyk for 2.5 tabs QD written by a Dr Mortimer Fries which they filled. I advised that that provider does not practice here and they should call his/her office to verify dosage. We have not seen pt since March and the last Rx we have is 2 tabs QD. Pharm agreed.

## 2016-04-28 IMAGING — MR MR FOOT*L* W/O CM
4 of 6 series · 19 of 40 positions shown · non-contrast
Comparison: Radiographs 02/28/2015

CLINICAL DATA: Left foot pain for 3 months. Injured doing water
exercises.

EXAM:
MRI OF THE LEFT FOREFOOT WITHOUT CONTRAST
TECHNIQUE: Multiplanar, multisequence MR imaging was performed. No intravenous
contrast was administered.

[Series 4: T1 · coronal · 4.0mm · 0.23mm/px · 3 of 34 slices shown (1 of 2)]
[im 5/34]
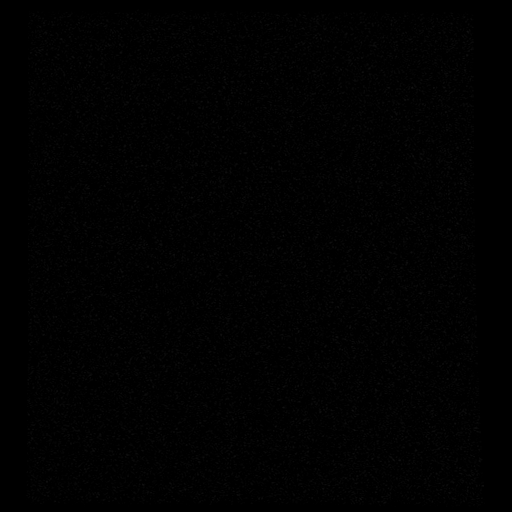
[im 17/34]
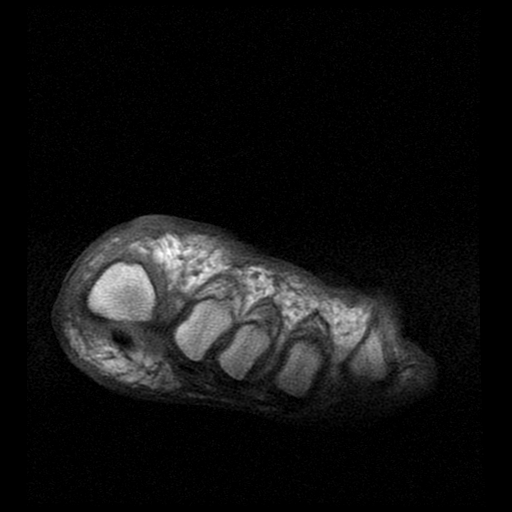
[im 29/34]
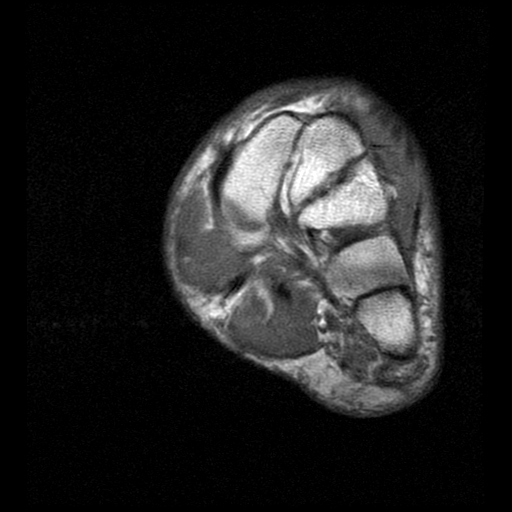

[Series 5: T1 · axial · 4.0mm · 0.31mm/px · z∈[-110,-39]mm · 3 of 20 slices shown (2 of 2)]
[im 1/20]
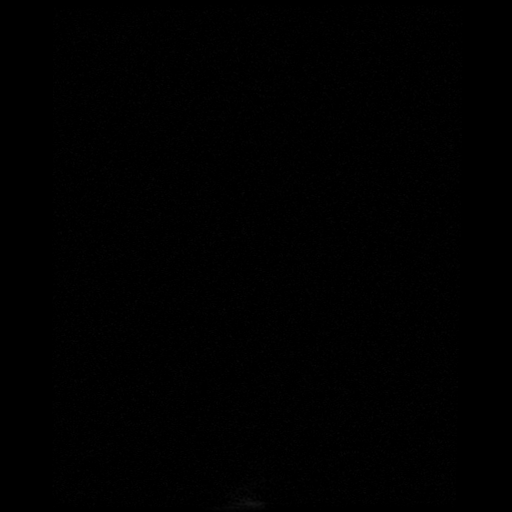
[im 10/20]
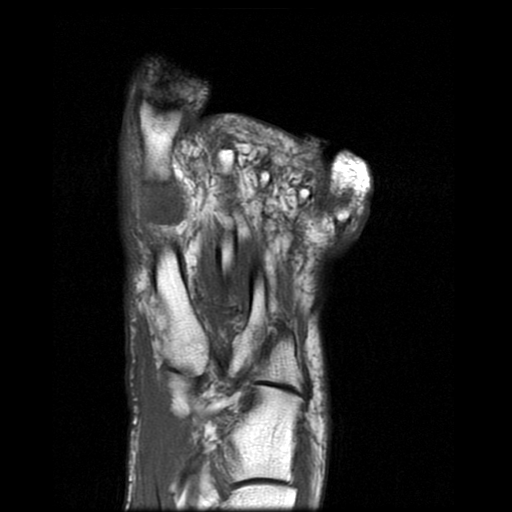
[im 20/20]
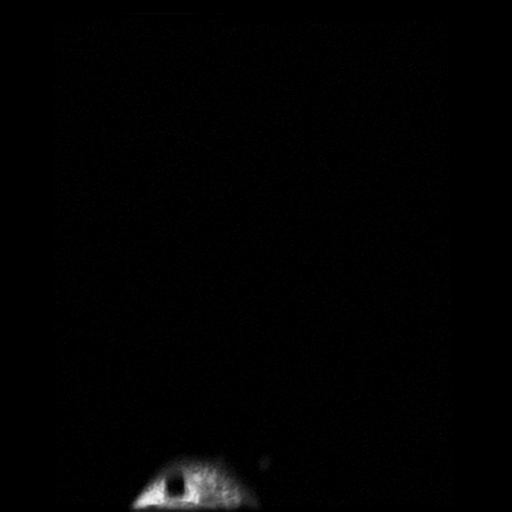

[Series 6: T2 fat-sat · axial · 4.0mm · 0.31mm/px · z∈[-110,-39]mm · 5 of 20 slices shown (1 of 2)]
[im 1/20]
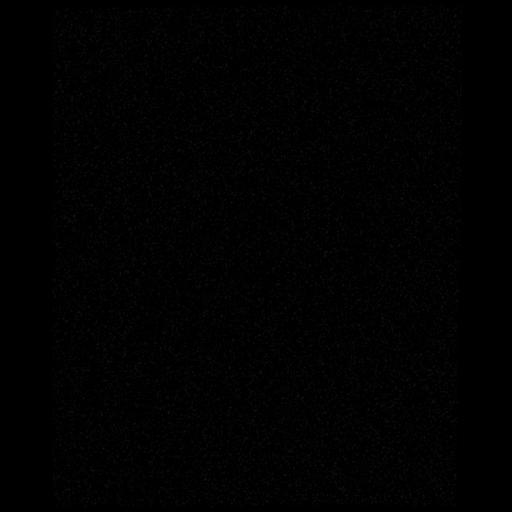
[im 5/20]
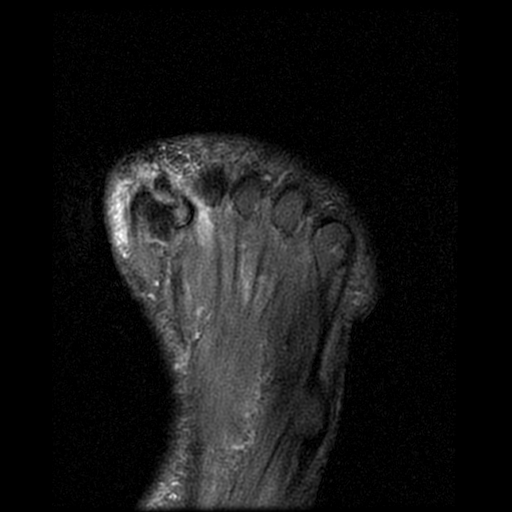
[im 10/20]
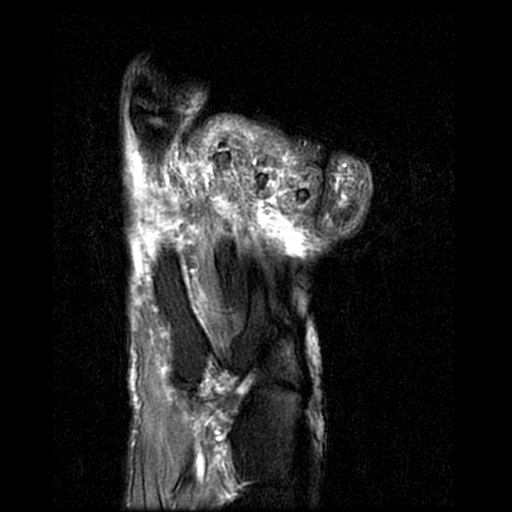
[im 15/20]
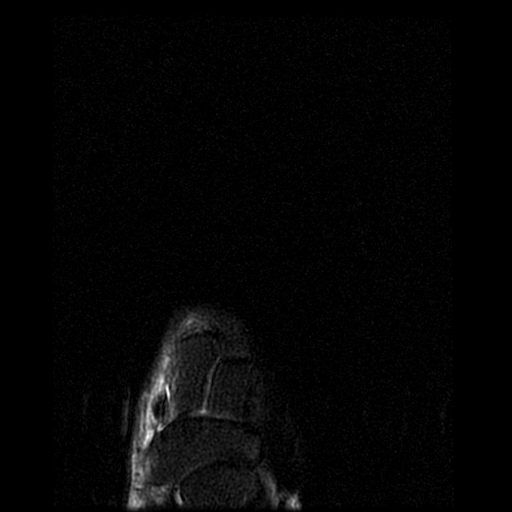
[im 20/20]
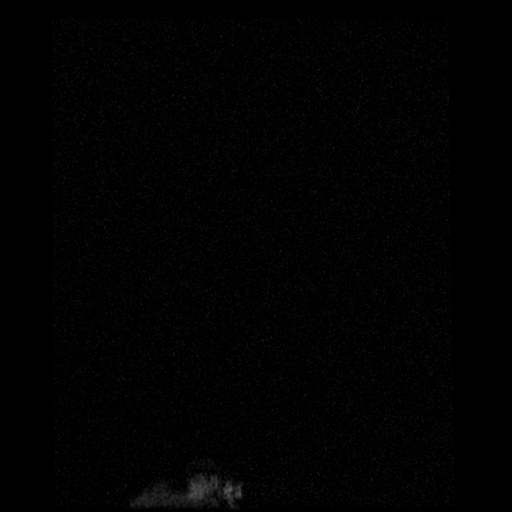

[Series 9: T2 fat-sat · sagittal · 3.0mm · 0.31mm/px · 8 of 30 slices shown (2 of 2)]
[im 1/30]
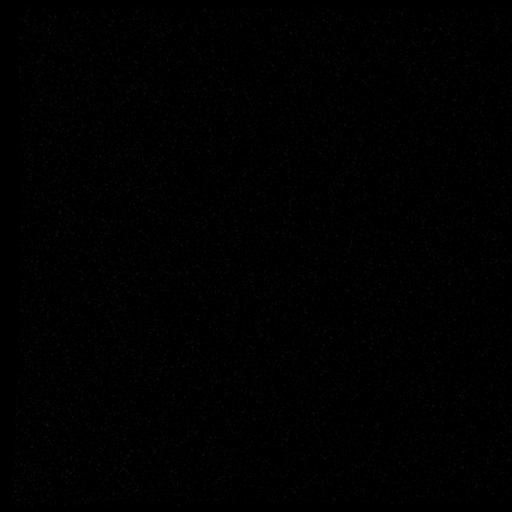
[im 5/30]
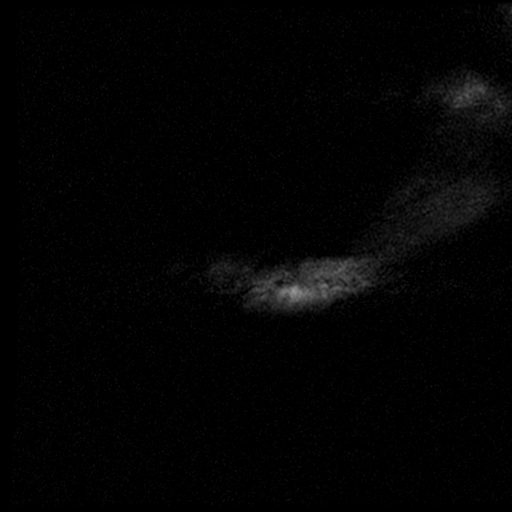
[im 9/30]
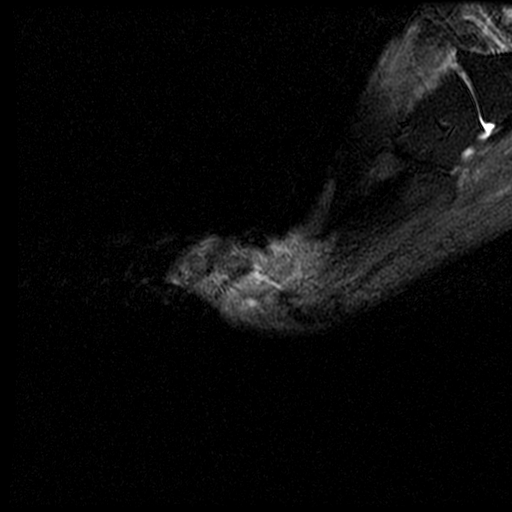
[im 13/30]
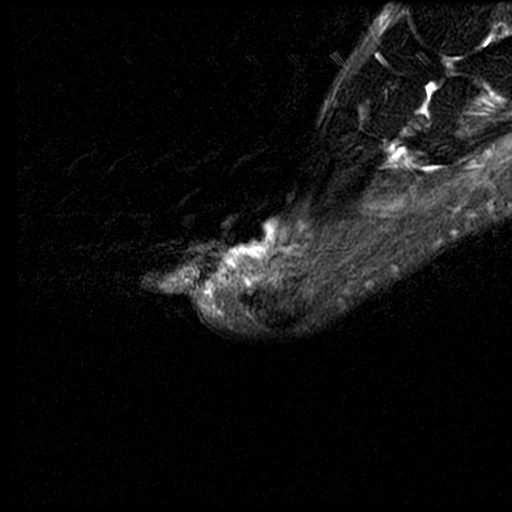
[im 17/30]
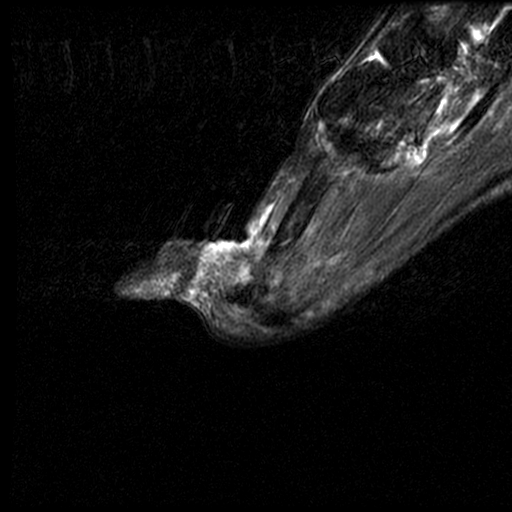
[im 21/30]
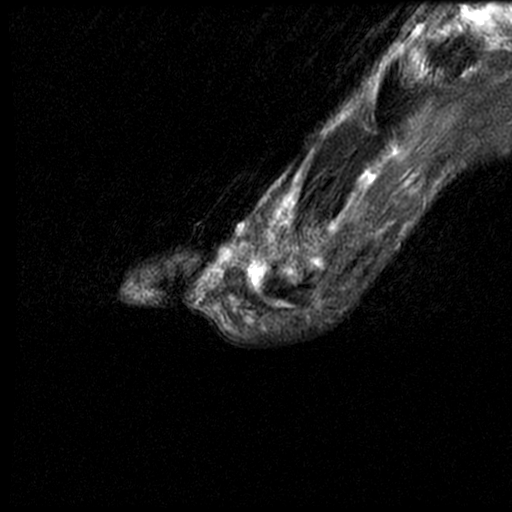
[im 25/30]
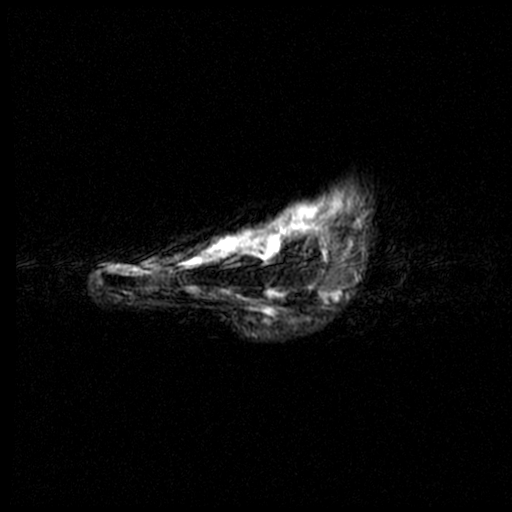
[im 30/30]
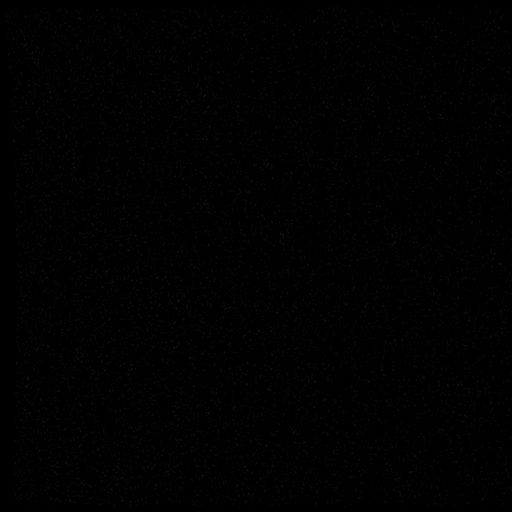

[19 of 40 positions shown; findings below may reference images not displayed]

FINDINGS: Examination is somewhat limited by patient motion.

No fracture or osteochondral abnormality is identified. There are
mild midfoot and forefoot degenerative changes. There are small
joint effusions at the metatarsal phalangeal joints along with
intermetatarsal bursitis. No obvious erosions. The foot musculature
is grossly normal. The major tendons and ligaments appear intact.
IMPRESSION: Limited examination due to patient motion.

Metatarsal phalangeal joint effusions and intermetatarsal bursitis.
This could be posttraumatic change or nonspecific metatarsalgia.

No fracture or osteochondral abnormality.

## 2016-07-21 ENCOUNTER — Ambulatory Visit: Payer: BLUE CROSS/BLUE SHIELD

## 2016-08-02 ENCOUNTER — Encounter: Payer: Self-pay | Admitting: Family Medicine

## 2016-08-02 ENCOUNTER — Ambulatory Visit (INDEPENDENT_AMBULATORY_CARE_PROVIDER_SITE_OTHER): Payer: BLUE CROSS/BLUE SHIELD | Admitting: Family Medicine

## 2016-08-02 VITALS — BP 130/72 | HR 77 | Temp 98.1°F | Ht 64.0 in | Wt 206.0 lb

## 2016-08-02 DIAGNOSIS — N184 Chronic kidney disease, stage 4 (severe): Secondary | ICD-10-CM

## 2016-08-02 DIAGNOSIS — N028 Recurrent and persistent hematuria with other morphologic changes: Secondary | ICD-10-CM | POA: Diagnosis not present

## 2016-08-02 DIAGNOSIS — Z1211 Encounter for screening for malignant neoplasm of colon: Secondary | ICD-10-CM

## 2016-08-02 NOTE — Progress Notes (Signed)
By signing my name below, I, Mesha Guinyard, attest that this documentation has been prepared under the direction and in the presence of [name], MD.  Electronically Signed: Verlee Monte, Medical Scribe. 08/02/16. 3:38 PM.  Subjective:    Patient ID: April Banks, female    DOB: 10-10-1955, 60 y.o.   MRN: [id number]  HPI Chief Complaint  Patient presents with  . pre-surgical tests    HPI Comments: April Banks is a 60 y.o. female who presents to the [facility] for kidney transplant surgical clearance due to CKD, stage 4, from IgA nephropathy- followed by Dr. Joseph Berkshire. She needs to know if she's a candidate since she doesn't want to do dialysis. She needs a colonoscopy, mammogram, pap smear to rule out cancer before the transplant. Pt just found out 3 weeks ago about her new diagnoses, and it surprised her since she felt so good this year. Pt isn't a smoker. Denies experiencing abdominal bloating, abdominal pain, and melena.  Cancer Screening: Breast CA: Mammogram was done 10/25/15 no findings suspicious for malignancy, repeat in 1 year. Denies feeling any lumps, or bumps in her breast. Cervical CA: Reportedly no abnormal pap smears with the last one here at [facility]. Negative pap 06/2015 Colon CA: She never had colonoscopy before. Last neg stool testing was in 2015. Skin CA: Is followed by a dermatologist.  Immunizations: She was told she can't have any live vaccines after her transplant. Immunization History  Administered Date(s) Administered  . Influenza Split 06/08/2011, 06/16/2012, 05/28/2013  . Influenza,inj,Quad PF,36+ Mos 05/28/2013  . Influenza-Unspecified 05/17/2014, 07/10/2016  . PPD Test 03/22/2014  . Pneumococcal Conjugate-13 07/05/2014  . Pneumococcal Polysaccharide-23 09/10/2014  . Tdap 08/13/2006   Permit to Carry and Conceal: She would like to renew her permit. Denies hx of depression, anxiety, thoughts of self harm, SI, or being in current  legal trouble.  Came back from [place] 3 days ago.  Patient Active Problem List   Diagnosis Date Noted  . Right rotator cuff tendinitis 07/14/2015  . Chronic kidney disease (CKD), stage III (moderate) 06/05/2015  . Gouty arthritis 05/25/2015  . Gout 03/18/2015  . MVP (mitral valve prolapse) 08/25/2011  . Herpes genitalis in women 08/25/2011  . Other and unspecified hyperlipidemia 09/04/2010  . OBESITY, UNSPECIFIED 09/04/2010  . ESSENTIAL HYPERTENSION, BENIGN 09/04/2010  . IGA NEPHROPATHY 07/31/2010   Past Medical History:  Diagnosis Date  . Anemia   . Anxiety   . Basal cell carcinoma of leg   . Depression   . Fat necrosis (segmental) of breast 02/15/2012  . Heart murmur   . Hyperlipidemia   . Hypertension   . Nephrolithiasis   . Renal abscess    Past Surgical History:  Procedure Laterality Date  . ABDOMINAL HYSTERECTOMY    . ARTHROPLASTY W/ ARTHROSCOPY MEDIAL / LATERAL COMPARTMENT KNEE    . BREAST ENHANCEMENT SURGERY  1990  . BREAST SURGERY    . EYE SURGERY    . RENAL BIOPSY  2010  . TUBAL LIGATION     Allergies  Allergen Reactions  . Gantrisin [Sulfisoxazole] Shortness Of Breath and Nausea Only  . Sulfa Antibiotics Shortness Of Breath and Rash  . Ceclor [Cefaclor] Rash  . Macrodantin Nausea And Vomiting  . Tramadol Nausea And Vomiting  . Levaquin [Levofloxacin In D5w] Other (See Comments)    Arm pain  . Lisinopril Cough  . Pyridium [Phenazopyridine Hcl]   . Pyridium [Phenazopyridine Hcl] Nausea Only   Prior to  Admission medications   Medication Sig Start Date End Date Taking? Authorizing Provider  acetaminophen (TYLENOL) 500 MG tablet Take 500 mg by mouth every 6 (six) hours as needed.   Yes Historical Provider, MD  acyclovir (ZOVIRAX) 200 MG capsule TAKE 1 CAPSULE DAILY 10/19/15  Yes Wendie Agreste, MD  allopurinol (ZYLOPRIM) 100 MG tablet TAKE 2 TABLETS DAILY 04/10/16  Yes Wendie Agreste, MD  aspirin 81 MG tablet Take 81 mg by mouth daily.     Yes Historical  Provider, MD  colchicine 0.6 MG tablet Take 2 tablets (1.2 mg total) by mouth once as needed. May take 1 tablet 1 hour later if needed. 10/19/15  Yes Wendie Agreste, MD  COLCRYS 0.6 MG tablet TAKE 2 TABLETS ONCE AT ONSET OF SYMPTOMS, THEN 1 TABLET IN ONE HOUR IF NEEDED AS DIRECTED 11/26/15  Yes Wendie Agreste, MD  diltiazem (CARTIA XT) 180 MG 24 hr capsule Take 1 capsule (180 mg total) by mouth daily. 03/15/15  Yes Wendie Agreste, MD  fish oil-omega-3 fatty acids 1000 MG capsule Take 2 g by mouth 3 (three) times daily.     Yes Historical Provider, MD  furosemide (LASIX) 20 MG tablet Take 3 tablets (60 mg total) by mouth daily. 03/15/15  Yes Wendie Agreste, MD  hydrALAZINE (APRESOLINE) 25 MG tablet Take 25 mg by mouth 3 (three) times daily.   Yes Historical Provider, MD  potassium citrate (UROCIT-K) 10 MEQ (1080 MG) SR tablet TAKE 1 TABLET DAILY BEFORE BREAKFAST 02/28/15  Yes Wendie Agreste, MD  rosuvastatin (CRESTOR) 10 MG tablet Take 1 tablet (10 mg total) by mouth daily. 10/19/15  Yes Wendie Agreste, MD  valsartan (DIOVAN) 80 MG tablet TAKE 1 TABLET DAILY Patient taking differently: TAKE 1/2 TABLET DAILY 09/13/14  Yes Wendie Agreste, MD   Social History   Social History  . Marital status: Divorced    Spouse name: N/A  . Number of children: N/A  . Years of education: N/A   Occupational History  . Not on file.   Social History Main Topics  . Smoking status: Never Smoker  . Smokeless tobacco: Never Used  . Alcohol use 1.2 oz/week    2 Standard drinks or equivalent per week     Comment: rare occasion  . Drug use: No  . Sexual activity: Not on file   Other Topics Concern  . 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  Psychiatric/Behavioral: Negative for dysphoric mood, self-injury and sleep disturbance. The  patient is not nervous/anxious.    Objective:  Physical Exam  Constitutional: She appears well-developed and well-nourished. No distress.  HENT:  Head: Normocephalic and atraumatic.  Eyes: Conjunctivae are normal.  Neck: Neck supple.  Cardiovascular: Normal rate and regular rhythm.  Exam reveals no gallop and no friction rub.   Murmur heard.  Systolic murmur is present with a grade of 2/6  Pulmonary/Chest: Effort normal and breath sounds normal. No respiratory distress. She has no wheezes. She has no rales.  Abdominal: Soft. There is no tenderness.  Musculoskeletal: She exhibits edema (trace pedal).  Neurological: She is alert.  Skin: Skin is warm and dry.  Psychiatric: She has a normal mood and affect. Her behavior is normal.  Nursing note and vitals reviewed.  BP 130/72 (BP  Location: Left Arm, Patient Position: Sitting, Cuff Size: Large)   Pulse 77   Temp 98.1 F (36.7 C) (Oral)   Ht $R'5\' 4"'Je$  (1.626 m)   Wt 206 lb (93.4 kg)   SpO2 97%   BMI 35.36 kg/m  Assessment & Plan:  April Banks is a 60 y.o. female CKD (chronic kidney disease) stage 4, GFR 15-29 ml/min (HCC) IgA nephropathy  - plan for possible renal transplant. Will check for routine cancer screening as preliminary requirement.   -up to date on cervical cancer screening, but depending on requirements, may need to return for more recent testing.   Screen for colon cancer - Plan: Ambulatory referral to Gastroenterology  - due for colonoscopy - referral placed.   No orders of the defined types were placed in this encounter.  Patient Instructions   I would consider shingles vaccine (zostavax) as that is usually given around 60, but is a live virus vaccine. check with your  Nephrologist about this vaccine.   You are up to date on mammogram, and pap testing was normal in 2015.  That would still be up to date by current pap testing guidelines, but can be performed sooner if needed.   Let me know how I can help further  if other information needed to be considered for renal transplant.    IF you received an x-ray today, you will receive an invoice from [facility]. Please contact [facility] at [phone] with questions or concerns regarding your invoice.   IF you received labwork today, you will receive an invoice from [facility]. Please contact [facility] at [phone] with questions or concerns regarding your invoice.   Our billing staff will not be able to assist you with questions regarding bills from these companies.  You will be contacted with the lab results as soon as they are available. The fastest way to get your results is to activate your My Chart account. Instructions are located on the last page of this paperwork. If you have not heard from [place] regarding the results in 2 weeks, please contact this office.       I personally performed the services described in this documentation, which was scribed in my presence. The recorded information has been reviewed and considered, and addended by me as needed.   Signed,   [name], MD [facility].  08/07/16 9:51 AM

## 2016-08-02 NOTE — Patient Instructions (Addendum)
I would consider shingles vaccine (zostavax) as that is usually given around 60, but is a live virus vaccine. check with your  Nephrologist about this vaccine.   You are up to date on mammogram, and pap testing was normal in 2015.  That would still be up to date by current pap testing guidelines, but can be performed sooner if needed.   Let me know how I can help further if other information needed to be considered for renal transplant.    IF you received an x-ray today, you will receive an invoice from [facility]. Please contact [facility] at [phone] with questions or concerns regarding your invoice.   IF you received labwork today, you will receive an invoice from [facility]. Please contact [facility] at [phone] with questions or concerns regarding your invoice.   Our billing staff will not be able to assist you with questions regarding bills from these companies.  You will be contacted with the lab results as soon as they are available. The fastest way to get your results is to activate your My Chart account. Instructions are located on the last page of this paperwork. If you have not heard from [place] regarding the results in 2 weeks, please contact this office.

## 2016-08-07 ENCOUNTER — Encounter: Payer: Self-pay | Admitting: Physician Assistant

## 2016-08-20 ENCOUNTER — Ambulatory Visit (INDEPENDENT_AMBULATORY_CARE_PROVIDER_SITE_OTHER): Payer: BLUE CROSS/BLUE SHIELD | Admitting: Physician Assistant

## 2016-08-20 ENCOUNTER — Encounter: Payer: Self-pay | Admitting: Physician Assistant

## 2016-08-20 VITALS — BP 124/68 | HR 74 | Ht 64.0 in | Wt 203.0 lb

## 2016-08-20 DIAGNOSIS — Z1212 Encounter for screening for malignant neoplasm of rectum: Secondary | ICD-10-CM

## 2016-08-20 DIAGNOSIS — N184 Chronic kidney disease, stage 4 (severe): Secondary | ICD-10-CM

## 2016-08-20 DIAGNOSIS — Z1211 Encounter for screening for malignant neoplasm of colon: Secondary | ICD-10-CM | POA: Diagnosis not present

## 2016-08-20 MED ORDER — NA SULFATE-K SULFATE-MG SULF 17.5-3.13-1.6 GM/177ML PO SOLN: 1.0000 | ORAL | 0 refills | Status: AC

## 2016-08-20 MED ORDER — METOCLOPRAMIDE HCL 5 MG PO TABS: 5.0000 mg | ORAL_TABLET | ORAL | 0 refills | Status: DC

## 2016-08-20 MED ORDER — ONDANSETRON HCL 4 MG PO TABS: 4.0000 mg | ORAL_TABLET | Freq: Four times a day (QID) | ORAL | 0 refills | Status: DC | PRN

## 2016-08-20 NOTE — Progress Notes (Signed)
Chief Complaint: Screening colonoscopy  HPI:  April Banks is a 61 year old Caucasian female with a past history of anemia, anxiety, depression, mitral valve heart murmur, hyperlipidemia, hypertension and IggA nephropathy with chronic kidney disease stage IV, who was referred to me by Wendie Agreste, MD for a consult of a screening colonoscopy. The patient claims she has never been seen by any of our physicians in this office.   Today, the patient presents to clinic and tells me that she is going on the kidney transplant list to hopefully avoid dialysis. She was told she needed screening for multiple cancers before being an eligible candidate. She has been delaying her screening colonoscopy out of fear and anxiety over this procedure. She tells me that her mother did the same thing and finally had a colonoscopy around the age of 11-80 and had 6 months of abdominal pain afterwards. This only furthers the patient's fear. Patient also tells me she has problems with drinking "medicated liquids". She believes the prep will give her nausea and vomiting and that she may not be able to finish this. Patient denies any abdominal pain or change in bowel habits.   Past medical history is also significant for a mitral valve heart murmur which the patient tells me has been stable over the past 30 years since she was pregnant with her son.   Patient denies fever, chills, blood in her stool, melena, change in bowels, weight loss, fatigue, nausea, vomiting or abdominal pain.  Past Medical History:  Diagnosis Date  . Anemia   . Anxiety   . Basal cell carcinoma of leg   . Depression   . Fat necrosis (segmental) of breast 02/15/2012  . Heart murmur   . Hyperlipidemia   . Hypertension   . Nephrolithiasis   . Renal abscess     Past Surgical History:  Procedure Laterality Date  . ABDOMINAL HYSTERECTOMY    . ARTHROPLASTY W/ ARTHROSCOPY MEDIAL / LATERAL COMPARTMENT KNEE    . BREAST ENHANCEMENT SURGERY  1990  .  BREAST SURGERY    . EYE SURGERY    . RENAL BIOPSY  2010  . TUBAL LIGATION      Current Outpatient Prescriptions  Medication Sig Dispense Refill  . acetaminophen (TYLENOL) 500 MG tablet Take 500 mg by mouth every 6 (six) hours as needed.    Marland Kitchen acyclovir (ZOVIRAX) 200 MG capsule TAKE 1 CAPSULE DAILY 90 capsule 3  . allopurinol (ZYLOPRIM) 100 MG tablet TAKE 2 TABLETS DAILY 180 tablet 0  . aspirin 81 MG tablet Take 81 mg by mouth daily.      . colchicine 0.6 MG tablet Take 2 tablets (1.2 mg total) by mouth once as needed. May take 1 tablet 1 hour later if needed. 10 tablet 1  . COLCRYS 0.6 MG tablet TAKE 2 TABLETS ONCE AT ONSET OF SYMPTOMS, THEN 1 TABLET IN ONE HOUR IF NEEDED AS DIRECTED 15 tablet 0  . diltiazem (CARTIA XT) 180 MG 24 hr capsule Take 1 capsule (180 mg total) by mouth daily. 90 capsule 1  . fish oil-omega-3 fatty acids 1000 MG capsule Take 2 g by mouth 3 (three) times daily.      . furosemide (LASIX) 20 MG tablet Take 3 tablets (60 mg total) by mouth daily. 270 tablet 1  . hydrALAZINE (APRESOLINE) 25 MG tablet Take 25 mg by mouth 3 (three) times daily.    . potassium citrate (UROCIT-K) 10 MEQ (1080 MG) SR tablet TAKE 1 TABLET DAILY BEFORE  BREAKFAST 100 tablet 0  . rosuvastatin (CRESTOR) 10 MG tablet Take 1 tablet (10 mg total) by mouth daily. 90 tablet 1  . valsartan (DIOVAN) 80 MG tablet TAKE 1 TABLET DAILY (Patient taking differently: TAKE 1/2 TABLET DAILY) 90 tablet 0   No current facility-administered medications for this visit.     Allergies as of 08/20/2016 - Review Complete 08/20/2016  Allergen Reaction Noted  . Gantrisin [sulfisoxazole] Shortness Of Breath and Nausea Only 08/25/2011  . Sulfa antibiotics Shortness Of Breath and Rash 08/25/2011  . Ceclor [cefaclor] Rash 08/25/2011  . Macrodantin Nausea And Vomiting 08/25/2011  . Tramadol Nausea And Vomiting 10/19/2015  . Levaquin [levofloxacin in d5w] Other (See Comments) 05/25/2015  . Lisinopril Cough 07/05/2014  .  Pyridium [phenazopyridine hcl]  02/15/2012  . Pyridium [phenazopyridine hcl] Nausea Only 08/25/2011    Family History  Problem Relation Age of Onset  . Heart disease Father   . Skin cancer Father   . Cancer Father   . Hyperlipidemia Father   . Hypertension Father   . Colon cancer Maternal Grandmother   . Hyperlipidemia Mother   . Breast cancer Maternal Aunt     Social History   Social History  . Marital status: Divorced    Spouse name: N/A  . Number of children: N/A  . Years of education: N/A   Occupational History  . Not on file.   Social History Main Topics  . Smoking status: Never Smoker  . Smokeless tobacco: Never Used  . Alcohol use 1.2 oz/week    2 Standard drinks or equivalent per week     Comment: rare occasion  . Drug use: No  . Sexual activity: Not on file   Other Topics Concern  . 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 Lungs clear to auscultation bilaterally.   No wheezes, crackles, or rhonchi.  Cardiovascular: Normal S1, S2. 4/6 murmur Regular rate and rhythm. No peripheral edema, cyanosis or pallor.  Gastrointestinal:  Soft, nondistended, nontender. No rebound or guarding. Normal bowel sounds. No appreciable masses or hepatomegaly. Rectal:  Not performed.  Msk:  Symmetrical without gross deformities. Without edema, no deformity or joint abnormality.    Neurologic:  Alert and  oriented x4;  grossly normal neurologically.  Psychiatric:Demonstrates good judgement and reason without abnormal affect or behaviors.  Recent labs: 07/09/16-creatinine 2.47, BUN 41, hemoglobin 11.6-other CBC and CMP results,  Assessment: 1. Screening colonoscopy: Patient is a 61 year old and overdue for screening colonoscopy, they are also requiring this prior to being placed on the kidney transplant list 2. Chronic kidney disease stage IV  Plan: 1. We had a long discussion about her anxiety over having a colonoscopy regarding the prep as well as after effects. Apparently her mother did not do well after her screening colonoscopy when this was done at the age of  75-80. Patient really believes she will have nausea and vomiting with prep. 2. Prescribed Reglan 5 mg taken 30 minutes before starting the prep #2 3. Prescribed Zofran 4 mg every 4-6 hours for nausea #6 4. Discussed risks, benefits, limitations and alternatives and the patient agrees to proceed with a colonoscopy. This will be scheduled Dr. Fuller Plan as he is the supervising physician today. 5. Patient to follow in clinic per Dr. Lynne Leader recommendations after time of colonoscopy.  April Newer, PA-C [facility] 08/20/2016, 1:46 PM  Cc: Wendie Agreste, MD

## 2016-08-20 NOTE — Patient Instructions (Signed)
You have been scheduled for a colonoscopy. Please follow written instructions given to you at your visit today.  Please pick up your prep supplies at the pharmacy within the next 1-3 days. If you use inhalers (even only as needed), please bring them with you on the day of your procedure. Your physician has requested that you go to www.startemmi.com and enter the access code given to you at your visit today. This web site gives a general overview about your procedure. However, you should still follow specific instructions given to you by our office regarding your preparation for the procedure.\  We have sent the following medications to your pharmacy for you to pick up at your convenience: Reglan 5 mg as directed Zofran 4 mg every 4-6 hrs as needed.

## 2016-08-20 NOTE — Progress Notes (Signed)
Reviewed and agree with management plan.  Elona Yinger T. Aarya Quebedeaux, MD FACG 

## 2016-09-12 ENCOUNTER — Other Ambulatory Visit: Payer: Self-pay | Admitting: Family Medicine

## 2016-09-12 DIAGNOSIS — Z1231 Encounter for screening mammogram for malignant neoplasm of breast: Secondary | ICD-10-CM

## 2016-09-20 ENCOUNTER — Encounter: Payer: Self-pay | Admitting: Gastroenterology

## 2016-09-22 ENCOUNTER — Ambulatory Visit (INDEPENDENT_AMBULATORY_CARE_PROVIDER_SITE_OTHER): Payer: BLUE CROSS/BLUE SHIELD | Admitting: Family Medicine

## 2016-09-22 VITALS — BP 126/70 | HR 86 | Temp 98.3°F | Resp 17 | Ht 64.0 in | Wt 204.8 lb

## 2016-09-22 DIAGNOSIS — N184 Chronic kidney disease, stage 4 (severe): Secondary | ICD-10-CM | POA: Diagnosis not present

## 2016-09-22 DIAGNOSIS — Z124 Encounter for screening for malignant neoplasm of cervix: Secondary | ICD-10-CM | POA: Diagnosis not present

## 2016-09-22 DIAGNOSIS — Z1151 Encounter for screening for human papillomavirus (HPV): Secondary | ICD-10-CM

## 2016-09-22 NOTE — Patient Instructions (Signed)
     IF you received an x-ray today, you will receive an invoice from [facility]. Please contact [facility] at [phone] with questions or concerns regarding your invoice.   IF you received labwork today, you will receive an invoice from [facility]. Please contact [facility] at [phone] with questions or concerns regarding your invoice.   Our billing staff will not be able to assist you with questions regarding bills from these companies.  You will be contacted with the lab results as soon as they are available. The fastest way to get your results is to activate your My Chart account. Instructions are located on the last page of this paperwork. If you have not heard from us regarding the results in 2 weeks, please contact this office.     

## 2016-09-22 NOTE — Progress Notes (Signed)
Chief Complaint  Patient presents with  . Other    Needs pap smear for upcoming possible kidney transplant    HPI  Patient is here for a pap smear for cervical cancer screening for the transplant program She reports that she had a partial hysterectomy She is on the transplant list for her CKD  Past Medical History:  Diagnosis Date  . Anemia   . Anxiety   . Basal cell carcinoma of leg   . Depression   . Fat necrosis (segmental) of breast 02/15/2012  . Heart murmur   . Hyperlipidemia   . Hypertension   . Nephrolithiasis   . Renal abscess     Current Outpatient Prescriptions  Medication Sig Dispense Refill  . acetaminophen (TYLENOL) 500 MG tablet Take 500 mg by mouth every 6 (six) hours as needed.    Marland Kitchen acyclovir (ZOVIRAX) 200 MG capsule TAKE 1 CAPSULE DAILY 90 capsule 3  . allopurinol (ZYLOPRIM) 100 MG tablet TAKE 2 TABLETS DAILY (Patient taking differently: TAKE 2.5 TABLETS DAILY) 180 tablet 0  . aspirin 81 MG tablet Take 81 mg by mouth daily.      . colchicine 0.6 MG tablet Take 2 tablets (1.2 mg total) by mouth once as needed. May take 1 tablet 1 hour later if needed. 10 tablet 1  . COLCRYS 0.6 MG tablet TAKE 2 TABLETS ONCE AT ONSET OF SYMPTOMS, THEN 1 TABLET IN ONE HOUR IF NEEDED AS DIRECTED 15 tablet 0  . diltiazem (CARTIA XT) 180 MG 24 hr capsule Take 1 capsule (180 mg total) by mouth daily. 90 capsule 1  . fish oil-omega-3 fatty acids 1000 MG capsule Take 2 g by mouth 3 (three) times daily.      . furosemide (LASIX) 20 MG tablet Take 3 tablets (60 mg total) by mouth daily. 270 tablet 1  . hydrALAZINE (APRESOLINE) 25 MG tablet Take 25 mg by mouth 3 (three) times daily. Take 1.5 tablets BID    . metoCLOPramide (REGLAN) 5 MG tablet Take 1 tablet (5 mg total) by mouth as directed. One tablet 30 mins before prep. 2 tablet 0  . ondansetron (ZOFRAN) 4 MG tablet Take 1 tablet (4 mg total) by mouth every 6 (six) hours as needed for nausea or vomiting. 6 tablet 0  . potassium  citrate (UROCIT-K) 10 MEQ (1080 MG) SR tablet TAKE 1 TABLET DAILY BEFORE BREAKFAST 100 tablet 0  . rosuvastatin (CRESTOR) 10 MG tablet Take 1 tablet (10 mg total) by mouth daily. 90 tablet 1  . valsartan (DIOVAN) 80 MG tablet TAKE 1 TABLET DAILY (Patient taking differently: TAKE 1/2 TABLET DAILY) 90 tablet 0   No current facility-administered medications for this visit.     Allergies:  Allergies  Allergen Reactions  . Gantrisin [Sulfisoxazole] Shortness Of Breath and Nausea Only  . Sulfa Antibiotics Shortness Of Breath and Rash  . Ceclor [Cefaclor] Rash  . Macrodantin Nausea And Vomiting  . Tramadol Nausea And Vomiting  . Levaquin [Levofloxacin In D5w] Other (See Comments)    Arm pain  . Lisinopril Cough  . Pyridium [Phenazopyridine Hcl]   . Pyridium [Phenazopyridine Hcl] Nausea Only    Past Surgical History:  Procedure Laterality Date  . ABDOMINAL HYSTERECTOMY    . ARTHROPLASTY W/ ARTHROSCOPY MEDIAL / LATERAL COMPARTMENT KNEE    . BREAST ENHANCEMENT SURGERY  1990  . BREAST SURGERY    . EYE SURGERY    . RENAL BIOPSY  2010  . TUBAL LIGATION  Social History   Social History  . Marital status: Divorced    Spouse name: N/A  . Number of children: N/A  . Years of education: N/A   Social History Main Topics  . Smoking status: Never Smoker  . Smokeless tobacco: Never Used  . Alcohol use 1.2 oz/week    2 Standard drinks or equivalent per week     Comment: rare occasion  . Drug use: No  . Sexual activity: Not Asked   Other Topics Concern  . None   Social History Narrative   Divorced   Engineer, structural:  [employer]   Exercises    ROS  Objective: Vitals:   09/22/16 0804  BP: 126/70  Pulse: 86  Resp: 17  Temp: 98.3 F (36.8 C)  TempSrc: Oral  SpO2: 97%  Weight: 204 lb 12.8 oz (92.9 kg)  Height: $Remove'5\' 4"'WLgzvPf$  (1.626 m)    Physical Exam General: alert and oriented, in NAD Eyes: EOM intact, normal conjunctiva Gu: no cervix noted, no atrophy of the  vagina, no discharge Extremities: no lower extremity edema  Assessment and Plan April Banks was seen today for other.  Diagnoses and all orders for this visit:  Encounter for screening for cervical cancer  -     Cancel: Pap IG and HPV (high risk) DNA detection  CKD (chronic kidney disease) stage 4, GFR 15-29 ml/min (HCC)- completed cervical cancer screening per protocol  Screening for human papillomavirus (HPV)- vaginal pap smear done -     Pap IG and HPV (high risk) DNA detection     April Banks A April Banks

## 2016-09-26 LAB — PAP IG AND HPV HIGH-RISK
HPV, HIGH-RISK: NEGATIVE
PAP Smear Comment: 0

## 2016-10-02 ENCOUNTER — Encounter: Payer: Self-pay | Admitting: Gastroenterology

## 2016-10-02 ENCOUNTER — Ambulatory Visit (AMBULATORY_SURGERY_CENTER): Payer: BLUE CROSS/BLUE SHIELD | Admitting: Gastroenterology

## 2016-10-02 VITALS — BP 113/81 | HR 66 | Temp 99.6°F | Resp 13 | Ht 64.0 in | Wt 203.0 lb

## 2016-10-02 DIAGNOSIS — Z1211 Encounter for screening for malignant neoplasm of colon: Secondary | ICD-10-CM

## 2016-10-02 DIAGNOSIS — Z1212 Encounter for screening for malignant neoplasm of rectum: Secondary | ICD-10-CM | POA: Diagnosis not present

## 2016-10-02 MED ORDER — SODIUM CHLORIDE 0.9 % IV SOLN: 500.0000 mL | INTRAVENOUS | Status: AC

## 2016-10-02 NOTE — Patient Instructions (Signed)
Discharge instructions given. Handouts on diverticulosis and a high fiber diet. Resume previous medications. YOU HAD AN ENDOSCOPIC PROCEDURE TODAY AT [facility]:   Refer to the procedure report that was given to you for any specific questions about what was found during the examination.  If the procedure report does not answer your questions, please call your gastroenterologist to clarify.  If you requested that your care partner not be given the details of your procedure findings, then the procedure report has been included in a sealed envelope for you to review at your convenience later.  YOU SHOULD EXPECT: Some feelings of bloating in the abdomen. Passage of more gas than usual.  Walking can help get rid of the air that was put into your GI tract during the procedure and reduce the bloating. If you had a lower endoscopy (such as a colonoscopy or flexible sigmoidoscopy) you may notice spotting of blood in your stool or on the toilet paper. If you underwent a bowel prep for your procedure, you may not have a normal bowel movement for a few days.  Please Note:  You might notice some irritation and congestion in your nose or some drainage.  This is from the oxygen used during your procedure.  There is no need for concern and it should clear up in a day or so.  SYMPTOMS TO REPORT IMMEDIATELY:   Following lower endoscopy (colonoscopy or flexible sigmoidoscopy):  Excessive amounts of blood in the stool  Significant tenderness or worsening of abdominal pains  Swelling of the abdomen that is new, acute  Fever of 100F or higher   For urgent or emergent issues, a gastroenterologist can be reached at any hour by calling [phone].   DIET:  We do recommend a small meal at first, but then you may proceed to your regular diet.  Drink plenty of fluids but you should avoid alcoholic beverages for 24 hours.  ACTIVITY:  You should plan to take it easy for the rest of today and you  should NOT DRIVE or use heavy machinery until tomorrow (because of the sedation medicines used during the test).    FOLLOW UP: Our staff will call the number listed on your records the next business day following your procedure to check on you and address any questions or concerns that you may have regarding the information given to you following your procedure. If we do not reach you, we will leave a message.  However, if you are feeling well and you are not experiencing any problems, there is no need to return our call.  We will assume that you have returned to your regular daily activities without incident.  If any biopsies were taken you will be contacted by phone or by letter within the next 1-3 weeks.  Please call us at [phone] if you have not heard about the biopsies in 3 weeks.    SIGNATURES/CONFIDENTIALITY: You and/or your care partner have signed paperwork which will be entered into your electronic medical record.  These signatures attest to the fact that that the information above on your After Visit Summary has been reviewed and is understood.  Full responsibility of the confidentiality of this discharge information lies with you and/or your care-partner.

## 2016-10-02 NOTE — Op Note (Signed)
[facility] Patient Name: April Banks Procedure Date: 10/02/2016 2:54 PM MRN: [id number] Endoscopist: Ladene Artist , MD Age: 61 Referring MD:  Date of Birth: 1956-06-26 Gender: Female Account #: [id number] Procedure:                Colonoscopy Indications:              Screening for colorectal malignant neoplasm Medicines:                Monitored Anesthesia Care Procedure:                Pre-Anesthesia Assessment:                           - Prior to the procedure, a History and Physical                            was performed, and patient medications and                            allergies were reviewed. The patient's tolerance of                            previous anesthesia was also reviewed. The risks                            and benefits of the procedure and the sedation                            options and risks were discussed with the patient.                            All questions were answered, and informed consent                            was obtained. Prior Anticoagulants: The patient has                            taken no previous anticoagulant or antiplatelet                            agents. ASA Grade Assessment: II - A patient with                            mild systemic disease. After reviewing the risks                            and benefits, the patient was deemed in                            satisfactory condition to undergo the procedure.                           After obtaining informed consent, the colonoscope  was passed under direct vision. Throughout the                            procedure, the patient's blood pressure, pulse, and                            oxygen saturations were monitored continuously. The                            Model [id number]-[id number]) scope was introduced                            through the anus and advanced to the the cecum,                            identified by  appendiceal orifice and ileocecal                            valve. The ileocecal valve, appendiceal orifice,                            and rectum were photographed. The quality of the                            bowel preparation was good. The colonoscopy was                            performed without difficulty. The patient tolerated                            the procedure well. Scope In: 3:07:40 PM Scope Out: 3:20:03 PM Scope Withdrawal Time: 0 hours 9 minutes 21 seconds  Total Procedure Duration: 0 hours 12 minutes 23 seconds  Findings:                 The perianal and digital rectal examinations were                            normal.                           Multiple small-mouthed diverticula were found in                            the sigmoid colon. There was narrowing of the colon                            in association with the diverticular opening. There                            was evidence of diverticular spasm. There was no                            evidence of diverticular bleeding.  The exam was otherwise without abnormality on                            direct and retroflexion views. Complications:            No immediate complications. Estimated blood loss:                            None. Estimated Blood Loss:     Estimated blood loss: none. Impression:               - Moderate diverticulosis in the sigmoid colon.                           - The examination was otherwise normal on direct                            and retroflexion views.                           - No specimens collected. Recommendation:           - Repeat colonoscopy in 10 years for screening                            purposes.                           - Patient has a contact number available for                            emergencies. The signs and symptoms of potential                            delayed complications were discussed with the                             patient. Return to normal activities tomorrow.                            Written discharge instructions were provided to the                            patient.                           - Continue present medications.                           - High fiber diet indefinitely. Ladene Artist, MD 10/02/2016 3:23:11 PM This report has been signed electronically.

## 2016-10-02 NOTE — Progress Notes (Signed)
Pt's states no medical or surgical changes since previsit or office visit. 

## 2016-10-02 NOTE — Progress Notes (Signed)
Report given to PACU, vss 

## 2016-10-03 ENCOUNTER — Telehealth: Payer: Self-pay

## 2016-10-03 NOTE — Telephone Encounter (Signed)
Left a message at [phone] for the pt to call us back if any questions or concerns. maw

## 2016-10-05 ENCOUNTER — Telehealth: Payer: Self-pay | Admitting: *Deleted

## 2016-10-05 NOTE — Telephone Encounter (Signed)
  Follow up Call-  Call back number 10/02/2016  Post procedure Call Back phone  # [phone]  Permission to leave phone message Yes  Some recent data might be hidden     Patient questions:  Do you have a fever, pain , or abdominal swelling? No. Pain Score  0 *  Have you tolerated food without any problems? Yes.    Have you been able to return to your normal activities? Yes.    Do you have any questions about your discharge instructions: Diet   No. Medications  No. Follow up visit  No.  Do you have questions or concerns about your Care? No.  Actions: * If pain score is 4 or above: No action needed, pain <4.

## 2016-10-22 ENCOUNTER — Ambulatory Visit
Admission: RE | Admit: 2016-10-22 | Discharge: 2016-10-22 | Disposition: A | Discharge status: Home / Self Care | Payer: BLUE CROSS/BLUE SHIELD | Source: Ambulatory Visit | Attending: Family Medicine | Admitting: Family Medicine

## 2016-10-22 DIAGNOSIS — Z1231 Encounter for screening mammogram for malignant neoplasm of breast: Secondary | ICD-10-CM

## 2016-12-24 ENCOUNTER — Other Ambulatory Visit: Payer: Self-pay | Admitting: Family Medicine

## 2016-12-24 DIAGNOSIS — Z8619 Personal history of other infectious and parasitic diseases: Secondary | ICD-10-CM

## 2017-01-12 ENCOUNTER — Encounter: Payer: Self-pay | Admitting: Physician Assistant

## 2017-01-12 ENCOUNTER — Ambulatory Visit (INDEPENDENT_AMBULATORY_CARE_PROVIDER_SITE_OTHER): Payer: BLUE CROSS/BLUE SHIELD | Admitting: Physician Assistant

## 2017-01-12 VITALS — BP 143/82 | HR 68 | Temp 98.0°F | Resp 16 | Ht 64.0 in | Wt 205.4 lb

## 2017-01-12 DIAGNOSIS — R1030 Lower abdominal pain, unspecified: Secondary | ICD-10-CM | POA: Diagnosis not present

## 2017-01-12 DIAGNOSIS — R310 Gross hematuria: Secondary | ICD-10-CM

## 2017-01-12 LAB — POC MICROSCOPIC URINALYSIS (UMFC): Mucus: ABSENT

## 2017-01-12 LAB — POCT URINALYSIS DIP (MANUAL ENTRY)
Glucose, UA: NEGATIVE mg/dL
NITRITE UA: POSITIVE — AB
PH UA: 6 (ref 5.0–8.0)
Protein Ur, POC: >=300 mg/dL — AB
SPEC GRAV UA: 1.015 (ref 1.010–1.025)
Urobilinogen, UA: 1 E.U./dL

## 2017-01-12 MED ORDER — AMOXICILLIN-POT CLAVULANATE 875-125 MG PO TABS: 1.0000 | ORAL_TABLET | Freq: Two times a day (BID) | ORAL | 0 refills | Status: AC

## 2017-01-12 NOTE — Progress Notes (Signed)
PRIMARY [facility]ARE AT [place], [place] [address] [phone]  Date:  01/12/2017   Name:  April Banks   DOB:  08/03/1956   MRN:  [id number]  PCP:  Wendie Agreste, MD    History of Present Illness:  April Banks is a 61 y.o. female patient who presents to PCP with  Chief Complaint  Patient presents with  . Back Pain    yesterday  . Hematuria     Hx of kidney disease.  She noticed hematuria yesterday.  She then developed back pain.  She has a hx of kidney stone.  Pain that is going across her lower back.  Rates pain 8/10.  Today 5/10.  She took a hydrocodone which helped some.  No fever.  She has slight nausea.   No lower abdominal pain.   [place], with Dr. Joseph Berkshire  Patient Active Problem List   Diagnosis Date Noted  . Right rotator cuff tendinitis 07/14/2015  . Chronic kidney disease (CKD), stage III (moderate) 06/05/2015  . Gouty arthritis 05/25/2015  . Gout 03/18/2015  . MVP (mitral valve prolapse) 08/25/2011  . Herpes genitalis in women 08/25/2011  . Other and unspecified hyperlipidemia 09/04/2010  . OBESITY, UNSPECIFIED 09/04/2010  . ESSENTIAL HYPERTENSION, BENIGN 09/04/2010  . IGA NEPHROPATHY 07/31/2010    Past Medical History:  Diagnosis Date  . Anemia   . Anxiety   . Basal cell carcinoma of leg   . Depression   . Fat necrosis (segmental) of breast 02/15/2012  . Heart murmur   . Hyperlipidemia   . Hypertension   . Nephrolithiasis   . Renal abscess     Past Surgical History:  Procedure Laterality Date  . ABDOMINAL HYSTERECTOMY    . ARTHROPLASTY W/ ARTHROSCOPY MEDIAL / LATERAL COMPARTMENT KNEE    . AUGMENTATION MAMMAPLASTY Bilateral 08/13/1988  . BREAST ENHANCEMENT SURGERY  1990  . BREAST SURGERY    . EYE SURGERY    . RENAL BIOPSY  2010  . TUBAL LIGATION      Social History  Substance Use Topics  . Smoking status: Never Smoker  . Smokeless tobacco: Never Used  . Alcohol use 1.2 oz/week    2 Standard drinks or equivalent per week      Comment: rare occasion    Family History  Problem Relation Age of Onset  . Heart disease Father   . Skin cancer Father   . Cancer Father   . Hyperlipidemia Father   . Hypertension Father   . Colon cancer Maternal Grandmother   . Hyperlipidemia Mother   . Breast cancer Maternal Aunt     Allergies  Allergen Reactions  . Gantrisin [Sulfisoxazole] Shortness Of Breath and Nausea Only  . Sulfa Antibiotics Shortness Of Breath and Rash  . Ceclor [Cefaclor] Rash  . Macrodantin Nausea And Vomiting  . Tramadol Nausea And Vomiting  . Levaquin [Levofloxacin In D5w] Other (See Comments)    Arm pain  . Lisinopril Cough  . Pyridium [Phenazopyridine Hcl]   . Pyridium [Phenazopyridine Hcl] Nausea Only    Medication list has been reviewed and updated.  Current Outpatient Prescriptions on File Prior to Visit  Medication Sig Dispense Refill  . acetaminophen (TYLENOL) 500 MG tablet Take 500 mg by mouth every 6 (six) hours as needed.    Marland Kitchen acyclovir (ZOVIRAX) 200 MG capsule TAKE 1 CAPSULE DAILY 90 capsule 0  . allopurinol (ZYLOPRIM) 100 MG tablet TAKE 2 TABLETS DAILY (Patient taking differently: TAKE 2.5 TABLETS  DAILY) 180 tablet 0  . aspirin 81 MG tablet Take 81 mg by mouth daily.      . colchicine 0.6 MG tablet Take 2 tablets (1.2 mg total) by mouth once as needed. May take 1 tablet 1 hour later if needed. 10 tablet 1  . diltiazem (CARTIA XT) 180 MG 24 hr capsule Take 1 capsule (180 mg total) by mouth daily. 90 capsule 1  . fish oil-omega-3 fatty acids 1000 MG capsule Take 2 g by mouth 3 (three) times daily.      . furosemide (LASIX) 20 MG tablet Take 3 tablets (60 mg total) by mouth daily. 270 tablet 1  . hydrALAZINE (APRESOLINE) 25 MG tablet Take 25 mg by mouth 3 (three) times daily. Take 1.5 tablets BID    . potassium citrate (UROCIT-K) 10 MEQ (1080 MG) SR tablet TAKE 1 TABLET DAILY BEFORE BREAKFAST 100 tablet 0  . rosuvastatin (CRESTOR) 10 MG tablet Take 1 tablet (10 mg total) by mouth  daily. 90 tablet 1  . valsartan (DIOVAN) 80 MG tablet TAKE 1 TABLET DAILY (Patient taking differently: TAKE 1/2 TABLET DAILY) 90 tablet 0   Current Facility-Administered Medications on File Prior to Visit  Medication Dose Route Frequency Provider Last Rate Last Dose  . 0.9 %  sodium chloride infusion  500 mL Intravenous Continuous Ladene Artist, MD        ROS ROS otherwise unremarkable unless listed above.  Physical Examination: BP (!) 143/82   Pulse 68   Temp 98 F (36.7 C) (Oral)   Resp 16   Ht $R'5\' 4"'Hs$  (1.626 m)   Wt 205 lb 6 oz (93.2 kg)   SpO2 100%   BMI 35.25 kg/m  Ideal Body Weight: Weight in (lb) to have BMI = 25: 145.3  Physical Exam  Constitutional: She is oriented to person, place, and time. She appears well-developed and well-nourished. No distress.  HENT:  Head: Normocephalic and atraumatic.  Right Ear: External ear normal.  Left Ear: External ear normal.  Eyes: Conjunctivae and EOM are normal. Pupils are equal, round, and reactive to light.  Cardiovascular: Normal rate.   Pulmonary/Chest: Effort normal. No respiratory distress.  Abdominal: Soft. Normal appearance and bowel sounds are normal. There is CVA tenderness.  Neurological: She is alert and oriented to person, place, and time.  Skin: She is not diaphoretic.  Psychiatric: She has a normal mood and affect. Her behavior is normal.    Results for orders placed or performed in visit on 01/12/17  POCT urinalysis dipstick  Result Value Ref Range   Color, UA red (A) yellow   Clarity, UA cloudy (A) clear   Glucose, UA negative negative mg/dL   Bilirubin, UA small (A) negative   Ketones, POC UA trace (5) (A) negative mg/dL   Spec Grav, UA 1.015 1.010 - 1.025   Blood, UA large (A) negative   pH, UA 6.0 5.0 - 8.0   Protein Ur, POC >=300 (A) negative mg/dL   Urobilinogen, UA 1.0 0.2 or 1.0 E.U./dL   Nitrite, UA Positive (A) Negative   Leukocytes, UA Trace (A) Negative    Assessment and Plan: April Banks is a 61 y.o. female who is here today for dysuria  Gross hematuria - Plan: POCT urinalysis dipstick, POCT Microscopic Urinalysis ([facility]), amoxicillin-clavulanate (AUGMENTIN) 875-125 MG tablet, Urine culture  Lower abdominal pain - Plan: POCT urinalysis dipstick, POCT Microscopic Urinalysis ([facility]), amoxicillin-clavulanate (AUGMENTIN) 875-125 MG tablet, Urine culture  Ivar Drape, PA-C [facility] 6/5/20187:24 AM

## 2017-01-12 NOTE — Patient Instructions (Addendum)
     IF you received an x-ray today, you will receive an invoice from [facility]. Please contact [facility] at [phone] with questions or concerns regarding your invoice.   IF you received labwork today, you will receive an invoice from [facility]. Please contact [facility] at [phone] with questions or concerns regarding your invoice.   Our billing staff will not be able to assist you with questions regarding bills from these companies.  You will be contacted with the lab results as soon as they are available. The fastest way to get your results is to activate your My Chart account. Instructions are located on the last page of this paperwork. If you have not heard from us regarding the results in 2 weeks, please contact this office.     Pyelonephritis, Adult Pyelonephritis is a kidney infection. The kidneys are organs that help clean your blood by moving waste out of your blood and into your pee (urine). This infection can happen quickly, or it can last for a long time. In most cases, it clears up with treatment and does not cause other problems. Follow these instructions at home: Medicines  Take over-the-counter and prescription medicines only as told by your doctor.  Take your antibiotic medicine as told by your doctor. Do not stop taking the medicine even if you start to feel better. General instructions  Drink enough fluid to keep your pee clear or pale yellow.  Avoid caffeine, tea, and carbonated drinks.  Pee (urinate) often. Avoid holding in pee for long periods of time.  Pee before and after sex.  After pooping (having a bowel movement), women should wipe from front to back. Use each tissue only once.  Keep all follow-up visits as told by your doctor. This is important. Contact a doctor if:  You do not feel better after 2 days.  Your symptoms get worse.  You have a fever. Get help right away if:  You cannot take your medicine or drink fluids  as told.  You have chills and shaking.  You throw up (vomit).  You have very bad pain in your side (flank) or back.  You feel very weak or you pass out (faint). This information is not intended to replace advice given to you by your health care provider. Make sure you discuss any questions you have with your health care provider. Document Released: 09/06/2004 Document Revised: 01/05/2016 Document Reviewed: 11/22/2014 Elsevier Interactive Patient Education  2018 [employer]  

## 2017-01-12 NOTE — Progress Notes (Signed)
.  nn

## 2017-01-13 LAB — URINE CULTURE

## 2017-01-15 ENCOUNTER — Ambulatory Visit (INDEPENDENT_AMBULATORY_CARE_PROVIDER_SITE_OTHER): Payer: BLUE CROSS/BLUE SHIELD

## 2017-01-15 ENCOUNTER — Encounter: Payer: Self-pay | Admitting: Family Medicine

## 2017-01-15 ENCOUNTER — Ambulatory Visit (INDEPENDENT_AMBULATORY_CARE_PROVIDER_SITE_OTHER): Payer: BLUE CROSS/BLUE SHIELD | Admitting: Family Medicine

## 2017-01-15 VITALS — BP 119/77 | HR 94 | Temp 98.1°F | Resp 18 | Ht 63.78 in | Wt 204.4 lb

## 2017-01-15 DIAGNOSIS — R109 Unspecified abdominal pain: Secondary | ICD-10-CM

## 2017-01-15 DIAGNOSIS — R1032 Left lower quadrant pain: Secondary | ICD-10-CM

## 2017-01-15 DIAGNOSIS — R509 Fever, unspecified: Secondary | ICD-10-CM | POA: Diagnosis not present

## 2017-01-15 DIAGNOSIS — R310 Gross hematuria: Secondary | ICD-10-CM | POA: Diagnosis not present

## 2017-01-15 DIAGNOSIS — Z87442 Personal history of urinary calculi: Secondary | ICD-10-CM | POA: Diagnosis not present

## 2017-01-15 LAB — POCT URINALYSIS DIP (MANUAL ENTRY)
Bilirubin, UA: NEGATIVE
GLUCOSE UA: NEGATIVE mg/dL
Ketones, POC UA: NEGATIVE mg/dL
NITRITE UA: NEGATIVE
Spec Grav, UA: 1.02 (ref 1.010–1.025)
UROBILINOGEN UA: 0.2 U/dL
pH, UA: 5.5 (ref 5.0–8.0)

## 2017-01-15 LAB — POCT CBC
GRANULOCYTE PERCENT: 76.3 % (ref 37–80)
HEMATOCRIT: 31.6 % — AB (ref 37.7–47.9)
HEMOGLOBIN: 10.9 g/dL — AB (ref 12.2–16.2)
Lymph, poc: 1.6 (ref 0.6–3.4)
MCH, POC: 30.3 pg (ref 27–31.2)
MCHC: 34.4 g/dL (ref 31.8–35.4)
MCV: 88.2 fL (ref 80–97)
MID (cbc): 0.8 (ref 0–0.9)
MPV: 8.1 fL (ref 0–99.8)
POC GRANULOCYTE: 7.8 — AB (ref 2–6.9)
POC LYMPH PERCENT: 15.5 %L (ref 10–50)
POC MID %: 8.2 %M (ref 0–12)
Platelet Count, POC: 269 10*3/uL (ref 142–424)
RBC: 3.58 M/uL — AB (ref 4.04–5.48)
RDW, POC: 15.4 %
WBC: 10.2 10*3/uL (ref 4.6–10.2)

## 2017-01-15 LAB — POC MICROSCOPIC URINALYSIS (UMFC): MUCUS RE: ABSENT

## 2017-01-15 NOTE — Progress Notes (Signed)
Chief Complaint  Patient presents with  . Flank Pain    X2 days left side  . Pelvic Pain    X 2 days- pt states that she had  fever with chills    HPI   Pt reports that she has been having urinary symptoms She states that she had hematuria and presented to clinic on 01/12/17 for concern for a kidney stone She had kidney stones in the past She states that her pain is no longer in the flank but that she feels a sharp pain to the groin She feels like hte kidney stone has left the kidney and is in her ureter She reports last night having fever and chills   She reports that her last kidney stone episode felt just like this She previously had hydronephrosis requiring treatment  Past Medical History:  Diagnosis Date  . Anemia   . Anxiety   . Basal cell carcinoma of leg   . Depression   . Fat necrosis (segmental) of breast 02/15/2012  . Heart murmur   . Hyperlipidemia   . Hypertension   . Nephrolithiasis   . Renal abscess     Current Outpatient Prescriptions  Medication Sig Dispense Refill  . acetaminophen (TYLENOL) 500 MG tablet Take 500 mg by mouth every 6 (six) hours as needed.    Marland Kitchen acyclovir (ZOVIRAX) 200 MG capsule TAKE 1 CAPSULE DAILY 90 capsule 0  . allopurinol (ZYLOPRIM) 100 MG tablet TAKE 2 TABLETS DAILY (Patient taking differently: TAKE 2.5 TABLETS DAILY) 180 tablet 0  . amoxicillin-clavulanate (AUGMENTIN) 875-125 MG tablet Take 1 tablet by mouth 2 (two) times daily. 20 tablet 0  . aspirin 81 MG tablet Take 81 mg by mouth daily.      . colchicine 0.6 MG tablet Take 2 tablets (1.2 mg total) by mouth once as needed. May take 1 tablet 1 hour later if needed. 10 tablet 1  . diltiazem (CARTIA XT) 180 MG 24 hr capsule Take 1 capsule (180 mg total) by mouth daily. 90 capsule 1  . fish oil-omega-3 fatty acids 1000 MG capsule Take 2 g by mouth 3 (three) times daily.      . furosemide (LASIX) 20 MG tablet Take 3 tablets (60 mg total) by mouth daily. 270 tablet 1  . hydrALAZINE  (APRESOLINE) 25 MG tablet Take 25 mg by mouth 3 (three) times daily. Take 1.5 tablets BID    . potassium citrate (UROCIT-K) 10 MEQ (1080 MG) SR tablet TAKE 1 TABLET DAILY BEFORE BREAKFAST 100 tablet 0  . rosuvastatin (CRESTOR) 10 MG tablet Take 1 tablet (10 mg total) by mouth daily. 90 tablet 1  . valsartan (DIOVAN) 80 MG tablet TAKE 1 TABLET DAILY (Patient taking differently: TAKE 1/2 TABLET DAILY) 90 tablet 0   Current Facility-Administered Medications  Medication Dose Route Frequency Provider Last Rate Last Dose  . 0.9 %  sodium chloride infusion  500 mL Intravenous Continuous Ladene Artist, MD        Allergies:  Allergies  Allergen Reactions  . Gantrisin [Sulfisoxazole] Shortness Of Breath and Nausea Only  . Sulfa Antibiotics Shortness Of Breath and Rash  . Ceclor [Cefaclor] Rash  . Macrodantin Nausea And Vomiting  . Tramadol Nausea And Vomiting  . Levaquin [Levofloxacin In D5w] Other (See Comments)    Arm pain  . Lisinopril Cough  . Pyridium [Phenazopyridine Hcl]   . Pyridium [Phenazopyridine Hcl] Nausea Only    Past Surgical History:  Procedure Laterality Date  . ABDOMINAL HYSTERECTOMY    .  ARTHROPLASTY W/ ARTHROSCOPY MEDIAL / LATERAL COMPARTMENT KNEE    . AUGMENTATION MAMMAPLASTY Bilateral 08/13/1988  . BREAST ENHANCEMENT SURGERY  1990  . BREAST SURGERY    . EYE SURGERY    . RENAL BIOPSY  2010  . TUBAL LIGATION      Social History   Social History  . Marital status: Divorced    Spouse name: N/A  . Number of children: N/A  . Years of education: N/A   Social History Main Topics  . Smoking status: Never Smoker  . Smokeless tobacco: Never Used  . Alcohol use 1.2 oz/week    2 Standard drinks or equivalent per week     Comment: rare occasion  . Drug use: No  . Sexual activity: Not Asked   Other Topics Concern  . None   Social History Narrative   Divorced   Engineer, structural:  [employer]   Exercises    Review of Systems  Constitutional:        See hpi    See hpi  Objective: Vitals:   01/15/17 1039  BP: 119/77  Pulse: 94  Resp: 18  Temp: 98.1 F (36.7 C)  TempSrc: Oral  SpO2: 98%  Weight: 204 lb 6.4 oz (92.7 kg)  Height: 5' 3.78" (1.62 m)    Physical Exam  Constitutional: She is oriented to person, place, and time. She appears well-developed and well-nourished.  HENT:  Head: Normocephalic and atraumatic.  Eyes: Conjunctivae and EOM are normal.  Cardiovascular: Normal rate, regular rhythm and normal heart sounds.   No murmur heard. Pulmonary/Chest: Effort normal and breath sounds normal. No respiratory distress. She has no wheezes.  Abdominal: Normal appearance and bowel sounds are normal. There is no tenderness. There is no rigidity, no rebound, no guarding and no CVA tenderness.  Suprisingly nontender although pt points to her left inguinal area during the exam she is nontender there  Neurological: She is alert and oriented to person, place, and time.  Psychiatric: She has a normal mood and affect. Her behavior is normal. Judgment and thought content normal.    Component     Latest Ref Rng & Units 01/15/2017 01/15/2017        11:01 AM 11:01 AM  Color, UA     yellow yellow   Clarity, UA     clear cloudy (A)   Glucose     negative mg/dL negative   Bilirubin, UA     negative negative negative  Specific Gravity, UA     1.010 - 1.025 1.020   RBC, UA     negative large (A)   pH, UA     5.0 - 8.0 5.5   Protein, UA     negative mg/dL =100 (A)   Urobilinogen, UA     0.2 or 1.0 E.U./dL 0.2   Nitrite, UA     Negative Negative   Leukocytes, UA     Negative Small (1+) (A)   WBC,UR,HPF,POC     None WBC/hpf    RBC,UR,HPF,POC     None RBC/hpf    Bacteria     None, Too numerous to count    Mucus     Absent    Epithelial Cells, UR Per Microscopy     None, Too numerous to count cells/hpf     Component     Latest Ref Rng & Units 01/15/2017        11:04 AM  Color, UA     yellow  Clarity, UA     clear     Glucose     negative mg/dL   Bilirubin, UA     negative   Specific Gravity, UA     1.010 - 1.025   RBC, UA     negative   pH, UA     5.0 - 8.0   Protein, UA     negative mg/dL   Urobilinogen, UA     0.2 or 1.0 E.U./dL   Nitrite, UA     Negative   Leukocytes, UA     Negative   WBC,UR,HPF,POC     None WBC/hpf Too numerous to count (A)  RBC,UR,HPF,POC     None RBC/hpf Too numerous to count (A)  Bacteria     None, Too numerous to count Moderate (A)  Mucus     Absent Absent  Epithelial Cells, UR Per Microscopy     None, Too numerous to count cells/hpf None   Lab Results  Component Value Date   WBC 10.2 01/15/2017   HGB 10.9 (A) 01/15/2017   HCT 31.6 (A) 01/15/2017   MCV 88.2 01/15/2017   PLT 294 07/05/2014    IMPRESSION: 1. Small bilateral renal stones cannot be excluded. Evaluation of the kidneys and ureters for stone disease difficult due to overlying stool.  2. 5 mm calcification noted over the lower pelvis. A bladder stone cannot be excluded.  3. Large amount amount of stool noted throughout the colon suggesting constipation.   Electronically Signed   By: Marcello Moores  Register   On: 01/15/2017 11:31  Assessment and Plan Odalis was seen today for flank pain and pelvic pain.  Diagnoses and all orders for this visit:  Flank pain -     POCT urinalysis dipstick -     POCT Microscopic Urinalysis ([facility]) -     DG Abd 1 View -     POCT CBC  Fever and chills -     DG Abd 1 View -     POCT CBC -     US Renal; Future  Gross hematuria -     US Renal; Future  Left groin pain -     US Renal; Future  History of kidney stones -     US Renal; Future  Other orders -     Cancel: Urine culture  Medical decision making Unable to rule out stone or hydronephrosis due to stool obscuring the plain film Will send for renal US Pt instructed to use laxative to clear bowels Also advised to stop antibiotics as her urine culture was negative for infection Renal  US ordered as a stat Presence of blood as well as history is suggestive of acute kidney stone No leukocytosis and was on antibiotic so stable for discharge to get US Renal  A total of 25 minutes were spent face-to-face with the patient during this encounter and over half of that time was spent on counseling and coordination of care.   [place]

## 2017-01-15 NOTE — Patient Instructions (Addendum)
Take a laxative to have a bowel movement today before your ultrasound  GO TO [facility] 715 AM  01/16/17 [place], [place] [address] DRINK WATER ONLY PRIOR TEST TO FILL BLADDER    IF you received an x-ray today, you will receive an invoice from [facility]. Please contact [facility] at [phone] with questions or concerns regarding your invoice.   IF you received labwork today, you will receive an invoice from [facility]. Please contact [facility] at [phone] with questions or concerns regarding your invoice.   Our billing staff will not be able to assist you with questions regarding bills from these companies.  You will be contacted with the lab results as soon as they are available. The fastest way to get your results is to activate your My Chart account. Instructions are located on the last page of this paperwork. If you have not heard from [place] regarding the results in 2 weeks, please contact this office.      Hydronephrosis Hydronephrosis is the enlargement of a kidney due to a blockage that stops urine from flowing out of the body. What are the causes? Common causes of this condition include:  A birth (congenital) defect of the kidney.  A congenital defect of the tube through which urine travels (ureter).  Kidney stones.  An enlarged prostate gland.  A tumor.  Cancer of the prostate, bladder, uterus, ovary, or colon.  A blood clot.  What are the signs or symptoms? Symptoms of this condition include:  Pain or discomfort in your side (flank).  Swelling of the abdomen.  Pain in the abdomen.  Nausea and vomiting.  Fever.  Pain while passing urine.  Feeling of urgency to urinate.  Frequent urination.  Infection of the urinary tract.  In some cases, there are no symptoms. How is this diagnosed? This condition may be diagnosed with:  A medical history.  A physical exam.  Blood and urine tests to check kidney  function.  Imaging tests, such as an X-ray, ultrasound, CT scan, or MRI.  A test in which a rigid or flexible telescope (cystoscope) is used to view the site of the blockage.  How is this treated? Treatment for this condition depends on where the blockage is located, how long it has been there, and what caused it. The goal of treatment is to remove the blockage. Treatment options include:  A procedure to put in a soft tube to help drain urine.  Antibiotic medicines to treat or prevent infection.  Shock-wave therapy (lithotripsy) to help eliminate kidney stones.  Follow these instructions at home:  Get lots of rest.  Drink enough fluid to keep your urine clear or pale yellow.  If you have a drain in, follow your health care provider's instructions about how to care for it.  Take medicines only as directed by your health care provider.  If you were prescribed an antibiotic medicine, finish all of it even if you start to feel better.  Keep all follow-up visits as directed by your health care provider. This is important. Contact a health care provider if:  You continue to have symptoms after treatment.  You develop new symptoms.  You have a problem with a drainage device.  Your urine becomes cloudy or bloody.  You have a fever. Get help right away if:  You have severe flank or abdominal pain.  You develop vomiting and are unable to keep fluids down. This information is not intended to replace advice given  to you by your health care provider. Make sure you discuss any questions you have with your health care provider. Document Released: 05/27/2007 Document Revised: 01/05/2016 Document Reviewed: 07/26/2014 Elsevier Interactive Patient Education  [employer].

## 2017-01-16 ENCOUNTER — Telehealth: Payer: Self-pay | Admitting: Family Medicine

## 2017-01-16 ENCOUNTER — Ambulatory Visit (HOSPITAL_COMMUNITY): Payer: BLUE CROSS/BLUE SHIELD

## 2017-01-16 NOTE — Telephone Encounter (Signed)
PATIENT WOULD LIKE DR. STALLINGS TO KNOW THAT SHE PASSED THE KIDNEY STONE LAST NIGHT (01/15/17) AND SHE FEELS VERY GOOD. SHE CALLED [facility] THIS MORNING TO CANCEL THE RENAL ULTRASOUND THAT WAS SCHEDULED FOR 7:30 am. BEST PHONE [phone] (CELL) [place]

## 2017-03-11 ENCOUNTER — Other Ambulatory Visit: Payer: Self-pay | Admitting: Family Medicine

## 2017-03-11 DIAGNOSIS — Z8619 Personal history of other infectious and parasitic diseases: Secondary | ICD-10-CM

## 2017-06-24 ENCOUNTER — Other Ambulatory Visit: Payer: Self-pay

## 2017-06-24 ENCOUNTER — Encounter: Payer: Self-pay | Admitting: Family Medicine

## 2017-06-24 ENCOUNTER — Ambulatory Visit: Payer: BLUE CROSS/BLUE SHIELD | Admitting: Family Medicine

## 2017-06-24 VITALS — BP 138/81 | HR 66 | Temp 98.0°F | Resp 16 | Ht 63.78 in | Wt 203.2 lb

## 2017-06-24 DIAGNOSIS — F4322 Adjustment disorder with anxiety: Secondary | ICD-10-CM | POA: Diagnosis not present

## 2017-06-24 DIAGNOSIS — R079 Chest pain, unspecified: Secondary | ICD-10-CM

## 2017-06-24 DIAGNOSIS — F418 Other specified anxiety disorders: Secondary | ICD-10-CM

## 2017-06-24 MED ORDER — CLONAZEPAM 0.5 MG PO TABS: 0.2500 mg | ORAL_TABLET | Freq: Two times a day (BID) | ORAL | 1 refills | Status: AC | PRN

## 2017-06-24 NOTE — Patient Instructions (Addendum)
Keep appointment with therapist today, I have written for Klonopin lower dose to be taken 1/2-1 up to 3 times per day as needed. See other information below on adjustment disorder and anxiety. Al-Anon may also be a useful resource.  If you need FMLA paperwork completed, I'll be happy to do so. Please let me know if other resources needed at home and update me by my chart in the next few weeks if you can. I'm happy to help sooner if needed.   Chest symptoms are likely due to the anxiety. If you do have worsening chest pain or persistent chest pains, please return discuss further.  Return to the clinic or go to the nearest emergency room if any of your symptoms worsen or new symptoms occur.    Adjustment Disorder, Adult Adjustment disorder is a group of symptoms that can develop after a stressful life event, such as the loss of a job or serious physical illness. The symptoms can affect how you feel, think, and act. They may interfere with your relationships. Adjustment disorder increases your risk of suicide and substance abuse. If this disorder is not managed early, it can develop into a more serious condition, such as major depressive disorder or post-traumatic stress disorder. What are the causes? This condition happens when you have trouble recovering from or coping with a stressful life event. What increases the risk? You are more likely to develop this condition if:  You have had depression or anxiety.  You are being treated for a long-term (chronic) illness.  You are being treated for an illness that cannot be cured (terminal illness).  You have a family history of mental illness.  What are the signs or symptoms? Symptoms of this condition include:  Extreme trouble doing daily tasks, such as going to work.  Sadness, depression, or crying spells.  Worrying a lot.  Loss of enjoyment.  Change in appetite or weight.  Feelings of loss or hopelessness.  Thoughts of  suicide.  Anxiety, worry, or nervousness.  Trouble sleeping.  Avoiding family and friends.  Fighting or vandalism.  Complaining of feeling sick without being ill.  Feeling dazed or disconnected.  Nightmares.  Trouble sleeping.  Irritability.  Reckless driving.  Poor work Systems analyst.  Ignoring bills.  Symptoms of this condition start within three months of the stressful event. They do not last more than six months, unless the stressful circumstances last longer. Normal grieving after the death of a loved one is not a symptom of this condition. How is this diagnosed? To diagnose this condition, your health care provider will ask about what has happened in your life and how it has affected you. He or she may also ask about your medical history and your use of medicines, alcohol, and other substances. Your health care provider may do a physical exam and order lab tests or other studies. You may be referred to a mental health specialist. How is this treated? Treatment options for this condition include:  Counseling or talk therapy. Talk therapy is usually provided by mental health specialists.  Medicines. Certain medicines may help with depression, anxiety, and sleep.  Support groups. These offer emotional support, advice, and guidance. They are made up of people who have had similar experiences.  Observation and time. This is sometimes called "watchful waiting." In this treatment, health care providers monitor your health and behavior without other treatment. Adjustment disorder sometimes gets better on its own with time.  Follow these instructions at home:  Take over-the-counter  and prescription medicines only as told by your health care provider.  Keep all follow-up visits as told by your health care provider. This is important. Contact a health care provider if:  Your symptoms do not improve in six months.  Your symptoms get worse. Get help right away if:  You have  serious thoughts about hurting yourself or someone else. If you ever feel like you may hurt yourself or others, or have thoughts about taking your own life, get help right away. You can go to your nearest emergency department or call:  Your local emergency services (911 in the [place]  A suicide crisis helpline, such as the [place] at 603-691-2304. This is open 24 hours a day.  Summary  Adjustment disorder is a group of symptoms that can develop after a stressful life event, such as the loss of a job or serious physical illness. The symptoms can affect how you feel, think, and act. They may interfere with your relationships.  Symptoms of this condition start within three months of the stressful event. They do not last more than six months, unless the stressful circumstances last longer.  Treatment may include talk therapy, medicines, participation in a support group, or observation to see if symptoms improve.  Contact your health care provider if your symptoms get worse or do not improve in six months.  If you ever feel like you may hurt yourself or others, or have thoughts about taking your own life, get help right away. This information is not intended to replace advice given to you by your health care provider. Make sure you discuss any questions you have with your health care provider. Document Released: 04/03/2006 Document Revised: 09/28/2016 Document Reviewed: 09/28/2016 Elsevier Interactive Patient Education  2018 [employer].     IF you received an x-ray today, you will receive an invoice from [facility]. Please contact [facility] at [phone] with questions or concerns regarding your invoice.   IF you received labwork today, you will receive an invoice from [facility]. Please contact [facility] at [phone] with questions or concerns regarding your invoice.   Our billing staff will not be able to assist you with questions  regarding bills from these companies.  You will be contacted with the lab results as soon as they are available. The fastest way to get your results is to activate your My Chart account. Instructions are located on the last page of this paperwork. If you have not heard from [place] regarding the results in 2 weeks, please contact this office.

## 2017-06-24 NOTE — Progress Notes (Signed)
Subjective:  This chart was scribed for Wendie Agreste, MD by Tamsen Roers, at [place] at [facility].  This patient was seen in room 14 and the patient's care was started at 11:28 AM.   Chief Complaint  Patient presents with  . Anxiety  . Insomnia     Patient ID: April Banks, female    DOB: 1955-11-19, 61 y.o.   MRN: [id number]  HPI HPI Comments: April Banks is a 61 y.o. female. 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 She is followed by Dr. Joseph Berkshire- with [facility] for her kidney disease, last visit October 22 nd with reported blood pressure ranging from 115-135/65- 80.  She was continued on same antihypertensives at that visit.  Previous hydralazine dose increased from 50 to 75 mg BID (March 2017).  Diovan decreased to 80 mg in 2016 due to increasing creatinine. Today she presents with  with increased anxiety and some difficulty sleeping.  She has had a history of anxiety years ago treated with Klonopin as needed and improved with exercise. Klonopin last prescribed in 2014.   Patients son had a car accident 2 weeks ago (was drinking and driving and hospitalized for 12 days)- had internal bleeding -states they almost "lost him".  Her uses a walker/wheelchair at the moment and she is the only one at home taking care of him as he has had rib fractures and multiple broken bones.  Patients son has been in treatment in the past for alcoholism and thought that he was recovering while living at home/going back to school but states that that was not the case.  She is having trust issues dealing with the fact that he was still drinking while living with her and does not necessarily "want him in the house".  She has given him the option to either leave after healing OR he can live with her if he attends AA once a week.  Her sons father (who is also an alcoholic) lives in [place] (came after the accident) but  had to go back home.  Aside from her situation with her sons recovery, she states that she is still waiting for a kidney (for transplantation) and is taking care of her elderly parents.  She has asked her parents to get some additional help as she will not be able to tend to their needs while taking care of her son.  Patient works full time and is unable to work at the moment due to her family situation (usually has to travel for work and has had to cancel trips for the next two weeks). She will be traveling to [place] the week after Thanksgiving.  She is worried about her son once she goes back to work.  She has been having non radiating chest tightness intermittently for the past three days (about 3-4 times) when she feels anxious.    Patient sleeps with ease due to "exhaustion" and has been waking up around 2:30 - 4:30 am  unable to go back to sleep afterwards as her mind is racing with thoughts.    She has gotten an appointment with a psychologist for this afternoon as she really feels the need to talk about everything.  States that she does not feel depressed but is very stressed as things are "out of control" in her life.   Patient took her left over Klonopin (she has used her dose of 30 pills over the  course of 4 years).  Patient is not regularly exercising.    Patient Active Problem List   Diagnosis Date Noted  . Right rotator cuff tendinitis 07/14/2015  . Chronic kidney disease (CKD), stage III (moderate) ([place]) 06/05/2015  . Gouty arthritis 05/25/2015  . Gout 03/18/2015  . MVP (mitral valve prolapse) 08/25/2011  . Herpes genitalis in women 08/25/2011  . Other and unspecified hyperlipidemia 09/04/2010  . OBESITY, UNSPECIFIED 09/04/2010  . ESSENTIAL HYPERTENSION, BENIGN 09/04/2010  . IGA NEPHROPATHY 07/31/2010   Past Medical History:  Diagnosis Date  . Anemia   . Anxiety   . Basal cell carcinoma of leg   . Depression   . Fat necrosis (segmental) of breast 02/15/2012  . Heart murmur   .  Hyperlipidemia   . Hypertension   . Nephrolithiasis   . Renal abscess    Past Surgical History:  Procedure Laterality Date  . ABDOMINAL HYSTERECTOMY    . ARTHROPLASTY W/ ARTHROSCOPY MEDIAL / LATERAL COMPARTMENT KNEE    . AUGMENTATION MAMMAPLASTY Bilateral 08/13/1988  . BREAST ENHANCEMENT SURGERY  1990  . BREAST SURGERY    . EYE SURGERY    . RENAL BIOPSY  2010  . TUBAL LIGATION     Allergies  Allergen Reactions  . Gantrisin [Sulfisoxazole] Shortness Of Breath and Nausea Only  . Sulfa Antibiotics Shortness Of Breath and Rash  . Ceclor [Cefaclor] Rash  . Macrodantin Nausea And Vomiting  . Tramadol Nausea And Vomiting  . Levaquin [Levofloxacin In D5w] Other (See Comments)    Arm pain  . Lisinopril Cough  . Pyridium [Phenazopyridine Hcl]   . Pyridium [Phenazopyridine Hcl] Nausea Only   Prior to Admission medications   Medication Sig Start Date End Date Taking? Authorizing Provider  acetaminophen (TYLENOL) 500 MG tablet Take 500 mg by mouth every 6 (six) hours as needed.    [provider]  acyclovir (ZOVIRAX) 200 MG capsule TAKE 1 CAPSULE DAILY 03/13/17   Delia Chimes A, MD  allopurinol (ZYLOPRIM) 100 MG tablet TAKE 2 TABLETS DAILY Patient taking differently: TAKE 2.5 TABLETS DAILY 04/10/16   Wendie Agreste, MD  aspirin 81 MG tablet Take 81 mg by mouth daily.      [provider]  colchicine 0.6 MG tablet Take 2 tablets (1.2 mg total) by mouth once as needed. May take 1 tablet 1 hour later if needed. 10/19/15   Wendie Agreste, MD  diltiazem (CARTIA XT) 180 MG 24 hr capsule Take 1 capsule (180 mg total) by mouth daily. 03/15/15   Wendie Agreste, MD  fish oil-omega-3 fatty acids 1000 MG capsule Take 2 g by mouth 3 (three) times daily.      [provider]  furosemide (LASIX) 20 MG tablet Take 3 tablets (60 mg total) by mouth daily. 03/15/15   Wendie Agreste, MD  hydrALAZINE (APRESOLINE) 25 MG tablet Take 25 mg by mouth 3 (three) times daily. Take 1.5  tablets BID    [provider]  potassium citrate (UROCIT-K) 10 MEQ (1080 MG) SR tablet TAKE 1 TABLET DAILY BEFORE BREAKFAST 02/28/15   Wendie Agreste, MD  rosuvastatin (CRESTOR) 10 MG tablet Take 1 tablet (10 mg total) by mouth daily. 10/19/15   Wendie Agreste, MD  valsartan (DIOVAN) 80 MG tablet TAKE 1 TABLET DAILY Patient taking differently: TAKE 1/2 TABLET DAILY 09/13/14   Wendie Agreste, MD   Social History   Socioeconomic History  . Marital status: Divorced    Spouse name: Not  on file  . Number of children: Not on file  . Years of education: Not on file  . Highest education level: Not on file  Social Needs  . Financial resource strain: Not on file  . Food insecurity - worry: Not on file  . Food insecurity - inability: Not on file  . Transportation needs - medical: Not on file  . Transportation needs - non-medical: Not on file  Occupational History  . Not on file  Tobacco Use  . Smoking status: Never Smoker  . Smokeless tobacco: Never Used  Substance and Sexual Activity  . Alcohol use: Yes    Alcohol/week: 1.2 oz    Types: 2 Standard drinks or equivalent per week    Comment: rare occasion  . Drug use: No  . Sexual activity: Not on file  Other Topics Concern  . Not on file  Social History Narrative   Divorced   Engineer, structural:  [employer]   Exercises      Review of Systems  Constitutional: Negative for chills, fatigue, fever and unexpected weight change.  Eyes: Negative for pain and itching.  Respiratory: Negative for cough, choking, chest tightness and shortness of breath.   Cardiovascular: Negative for chest pain, palpitations and leg swelling.  Gastrointestinal: Negative for abdominal pain, blood in stool, nausea and vomiting.  Neurological: Negative for dizziness, syncope, speech difficulty, light-headedness and headaches.  Psychiatric/Behavioral: Positive for sleep disturbance.       Anxiety       Objective:   Physical Exam    Constitutional: She is oriented to person, place, and time. She appears well-developed and well-nourished.  HENT:  Head: Normocephalic and atraumatic.  Eyes: Conjunctivae and EOM are normal. Pupils are equal, round, and reactive to light.  Neck: Carotid bruit is not present.  Cardiovascular: Normal rate, regular rhythm, normal heart sounds and intact distal pulses.  Pulmonary/Chest: Effort normal and breath sounds normal.  Chest wall was non tender   Abdominal: Soft. She exhibits no pulsatile midline mass. There is no tenderness.  Neurological: She is alert and oriented to person, place, and time.  Skin: Skin is warm and dry.  Psychiatric: She has a normal mood and affect. Her behavior is normal.  Vitals reviewed.  Vitals:   06/24/17 1110  BP: 138/81  Pulse: 66  Resp: 16  Temp: 98 F (36.7 C)  TempSrc: Oral  SpO2: 98%  Weight: 203 lb 3.2 oz (92.2 kg)  Height: 5' 3.78" (1.62 m)    EKG: Sinus rhythm, bradycardia rate 54. PR 174, QT 436. Flat T in lead III, no acute/concerning findings      Assessment & Plan:    April Banks is a 61 y.o. female Chest pain, unspecified type - Plan: EKG 12-Lead  - Nonexertional, noted during times of anxiety. EKG without acute concerning findings. Unlikely cardiac. Treatment of anxiety as below, RTC/ER precautions if recurrent/worsening  Situational anxiety - Plan: clonazePAM (KLONOPIN) 0.5 MG tablet Adjustment disorder with anxious mood - Plan: clonazePAM (KLONOPIN) 0.5 MG tablet  - Multiple stressors right now as above. Had intermittent/situational anxiety years ago, but had been doing well recently until all these recent stressors.  - Planned follow-up with psychology today, Klonopin provided as needed for situational anxiety. Do not see need for SSRI at this time.  - Discussed Al-Anon to help with coping with son's alcoholism  - Option of FMLA discussed if needed for caring for her son during this time.  -Recommended returning to  some  form of exercise throughout the week as it has been helpful for stress management previously.  -Plans on updating me by mychart was in the next few weeks.   Meds ordered this encounter  Medications  . losartan (COZAAR) 50 MG tablet    Sig: Take 50 mg by mouth.  . clonazePAM (KLONOPIN) 0.5 MG tablet    Sig: Take 0.5-1 tablets (0.25-0.5 mg total) 2 (two) times daily as needed by mouth for anxiety.    Dispense:  20 tablet    Refill:  1   Patient Instructions   Keep appointment with therapist today, I have written for Klonopin lower dose to be taken 1/2-1 up to 3 times per day as needed. See other information below on adjustment disorder and anxiety. Al-Anon may also be a useful resource.  If you need FMLA paperwork completed, I'll be happy to do so. Please let me know if other resources needed at home and update me by my chart in the next few weeks if you can. I'm happy to help sooner if needed.   Chest symptoms are likely due to the anxiety. If you do have worsening chest pain or persistent chest pains, please return discuss further.  Return to the clinic or go to the nearest emergency room if any of your symptoms worsen or new symptoms occur.    Adjustment Disorder, Adult Adjustment disorder is a group of symptoms that can develop after a stressful life event, such as the loss of a job or serious physical illness. The symptoms can affect how you feel, think, and act. They may interfere with your relationships. Adjustment disorder increases your risk of suicide and substance abuse. If this disorder is not managed early, it can develop into a more serious condition, such as major depressive disorder or post-traumatic stress disorder. What are the causes? This condition happens when you have trouble recovering from or coping with a stressful life event. What increases the risk? You are more likely to develop this condition if:  You have had depression or anxiety.  You are being treated for  a long-term (chronic) illness.  You are being treated for an illness that cannot be cured (terminal illness).  You have a family history of mental illness.  What are the signs or symptoms? Symptoms of this condition include:  Extreme trouble doing daily tasks, such as going to work.  Sadness, depression, or crying spells.  Worrying a lot.  Loss of enjoyment.  Change in appetite or weight.  Feelings of loss or hopelessness.  Thoughts of suicide.  Anxiety, worry, or nervousness.  Trouble sleeping.  Avoiding family and friends.  Fighting or vandalism.  Complaining of feeling sick without being ill.  Feeling dazed or disconnected.  Nightmares.  Trouble sleeping.  Irritability.  Reckless driving.  Poor work Systems analyst.  Ignoring bills.  Symptoms of this condition start within three months of the stressful event. They do not last more than six months, unless the stressful circumstances last longer. Normal grieving after the death of a loved one is not a symptom of this condition. How is this diagnosed? To diagnose this condition, your health care provider will ask about what has happened in your life and how it has affected you. He or she may also ask about your medical history and your use of medicines, alcohol, and other substances. Your health care provider may do a physical exam and order lab tests or other studies. You may be referred to a mental health  specialist. How is this treated? Treatment options for this condition include:  Counseling or talk therapy. Talk therapy is usually provided by mental health specialists.  Medicines. Certain medicines may help with depression, anxiety, and sleep.  Support groups. These offer emotional support, advice, and guidance. They are made up of people who have had similar experiences.  Observation and time. This is sometimes called "watchful waiting." In this treatment, health care providers monitor your health and  behavior without other treatment. Adjustment disorder sometimes gets better on its own with time.  Follow these instructions at home:  Take over-the-counter and prescription medicines only as told by your health care provider.  Keep all follow-up visits as told by your health care provider. This is important. Contact a health care provider if:  Your symptoms do not improve in six months.  Your symptoms get worse. Get help right away if:  You have serious thoughts about hurting yourself or someone else. If you ever feel like you may hurt yourself or others, or have thoughts about taking your own life, get help right away. You can go to your nearest emergency department or call:  Your local emergency services (911 in the [place]  A suicide crisis helpline, such as the [place] at 754-599-7010. This is open 24 hours a day.  Summary  Adjustment disorder is a group of symptoms that can develop after a stressful life event, such as the loss of a job or serious physical illness. The symptoms can affect how you feel, think, and act. They may interfere with your relationships.  Symptoms of this condition start within three months of the stressful event. They do not last more than six months, unless the stressful circumstances last longer.  Treatment may include talk therapy, medicines, participation in a support group, or observation to see if symptoms improve.  Contact your health care provider if your symptoms get worse or do not improve in six months.  If you ever feel like you may hurt yourself or others, or have thoughts about taking your own life, get help right away. This information is not intended to replace advice given to you by your health care provider. Make sure you discuss any questions you have with your health care provider. Document Released: 04/03/2006 Document Revised: 09/28/2016 Document Reviewed: 09/28/2016 Elsevier Interactive Patient  Education  2018 [employer].     IF you received an x-ray today, you will receive an invoice from [facility]. Please contact [facility] at [phone] with questions or concerns regarding your invoice.   IF you received labwork today, you will receive an invoice from [facility]. Please contact [facility] at [phone] with questions or concerns regarding your invoice.   Our billing staff will not be able to assist you with questions regarding bills from these companies.  You will be contacted with the lab results as soon as they are available. The fastest way to get your results is to activate your My Chart account. Instructions are located on the last page of this paperwork. If you have not heard from [place] regarding the results in 2 weeks, please contact this office.      I personally performed the services described in this documentation, which was scribed in my presence. The recorded information has been reviewed and considered for accuracy and completeness, addended by me as needed, and agree with information above.  Signed,   [name], MD [facility] [place] [facility].  06/24/17 12:38 PM

## 2017-07-30 ENCOUNTER — Other Ambulatory Visit: Payer: Self-pay | Admitting: Family Medicine

## 2017-07-30 DIAGNOSIS — Z8619 Personal history of other infectious and parasitic diseases: Secondary | ICD-10-CM

## 2017-10-14 ENCOUNTER — Other Ambulatory Visit: Payer: Self-pay | Admitting: Family Medicine

## 2017-10-14 DIAGNOSIS — Z1231 Encounter for screening mammogram for malignant neoplasm of breast: Secondary | ICD-10-CM

## 2017-11-09 ENCOUNTER — Other Ambulatory Visit: Payer: Self-pay | Admitting: Family Medicine

## 2017-11-09 DIAGNOSIS — Z8619 Personal history of other infectious and parasitic diseases: Secondary | ICD-10-CM

## 2017-11-11 NOTE — Telephone Encounter (Signed)
Patient called to inform that she will need to schedule a physical with Dr. Carlota Raspberry to get her refills of Acyclovir, she agrees. Appointment scheduled for May 2 at 1520. Advised a 30 day supply will be sent to her pharmacy, patient verbalized understanding.

## 2017-11-20 ENCOUNTER — Encounter: Payer: Self-pay | Admitting: Physician Assistant

## 2017-11-25 ENCOUNTER — Ambulatory Visit
Admission: RE | Admit: 2017-11-25 | Discharge: 2017-11-25 | Disposition: A | Discharge status: Home / Self Care | Payer: BLUE CROSS/BLUE SHIELD | Source: Ambulatory Visit | Attending: Family Medicine | Admitting: Family Medicine

## 2017-11-25 DIAGNOSIS — Z1231 Encounter for screening mammogram for malignant neoplasm of breast: Secondary | ICD-10-CM

## 2017-12-12 ENCOUNTER — Ambulatory Visit (INDEPENDENT_AMBULATORY_CARE_PROVIDER_SITE_OTHER): Payer: BLUE CROSS/BLUE SHIELD | Admitting: Family Medicine

## 2017-12-12 ENCOUNTER — Encounter: Payer: Self-pay | Admitting: Family Medicine

## 2017-12-12 VITALS — HR 85 | Temp 98.2°F | Ht 64.0 in | Wt 207.2 lb

## 2017-12-12 DIAGNOSIS — I10 Essential (primary) hypertension: Secondary | ICD-10-CM | POA: Diagnosis not present

## 2017-12-12 DIAGNOSIS — N184 Chronic kidney disease, stage 4 (severe): Secondary | ICD-10-CM

## 2017-12-12 DIAGNOSIS — E785 Hyperlipidemia, unspecified: Secondary | ICD-10-CM

## 2017-12-12 DIAGNOSIS — Z23 Encounter for immunization: Secondary | ICD-10-CM | POA: Diagnosis not present

## 2017-12-12 DIAGNOSIS — Z8619 Personal history of other infectious and parasitic diseases: Secondary | ICD-10-CM | POA: Diagnosis not present

## 2017-12-12 DIAGNOSIS — Z0000 Encounter for general adult medical examination without abnormal findings: Secondary | ICD-10-CM | POA: Diagnosis not present

## 2017-12-12 DIAGNOSIS — H6121 Impacted cerumen, right ear: Secondary | ICD-10-CM | POA: Diagnosis not present

## 2017-12-12 MED ORDER — ZOSTER VAC RECOMB ADJUVANTED 50 MCG/0.5ML IM SUSR: 0.5000 mL | Freq: Once | INTRAMUSCULAR | 1 refills | Status: DC

## 2017-12-12 MED ORDER — ZOSTER VAC RECOMB ADJUVANTED 50 MCG/0.5ML IM SUSR: 0.5000 mL | Freq: Once | INTRAMUSCULAR | 1 refills | Status: AC

## 2017-12-12 MED ORDER — ROSUVASTATIN CALCIUM 10 MG PO TABS: 10.0000 mg | ORAL_TABLET | Freq: Every day | ORAL | 1 refills | Status: AC

## 2017-12-12 MED ORDER — ACYCLOVIR 200 MG PO CAPS: 200.0000 mg | ORAL_CAPSULE | Freq: Every day | ORAL | 3 refills | Status: DC

## 2017-12-12 NOTE — Patient Instructions (Addendum)
I sent shingles vaccine to your pharmacy.   Tetanus updated today with pertussis.   Screening lab work today.   See info on cerumen impaction below.   Thank you for coming in today.    Earwax Buildup, Adult The ears produce a substance called earwax that helps keep bacteria out of the ear and protects the skin in the ear canal. Occasionally, earwax can build up in the ear and cause discomfort or hearing loss. What increases the risk? This condition is more likely to develop in people who:  Are female.  Are elderly.  Naturally produce more earwax.  Clean their ears often with cotton swabs.  Use earplugs often.  Use in-ear headphones often.  Wear hearing aids.  Have narrow ear canals.  Have earwax that is overly thick or sticky.  Have eczema.  Are dehydrated.  Have excess hair in the ear canal.  What are the signs or symptoms? Symptoms of this condition include:  Reduced or muffled hearing.  A feeling of fullness in the ear or feeling that the ear is plugged.  Fluid coming from the ear.  Ear pain.  Ear itch.  Ringing in the ear.  Coughing.  An obvious piece of earwax that can be seen inside the ear canal.  How is this diagnosed? This condition may be diagnosed based on:  Your symptoms.  Your medical history.  An ear exam. During the exam, your health care provider will look into your ear with an instrument called an otoscope.  You may have tests, including a hearing test. How is this treated? This condition may be treated by:  Using ear drops to soften the earwax.  Having the earwax removed by a health care provider. The health care provider may: ? Flush the ear with water. ? Use an instrument that has a loop on the end (curette). ? Use a suction device.  Surgery to remove the wax buildup. This may be done in severe cases.  Follow these instructions at home:  Take over-the-counter and prescription medicines only as told by your health  care provider.  Do not put any objects, including cotton swabs, into your ear. You can clean the opening of your ear canal with a washcloth or facial tissue.  Follow instructions from your health care provider about cleaning your ears. Do not over-clean your ears.  Drink enough fluid to keep your urine clear or pale yellow. This will help to thin the earwax.  Keep all follow-up visits as told by your health care provider. If earwax builds up in your ears often or if you use hearing aids, consider seeing your health care provider for routine, preventive ear cleanings. Ask your health care provider how often you should schedule your cleanings.  If you have hearing aids, clean them according to instructions from the manufacturer and your health care provider. Contact a health care provider if:  You have ear pain.  You develop a fever.  You have blood, pus, or other fluid coming from your ear.  You have hearing loss.  You have ringing in your ears that does not go away.  Your symptoms do not improve with treatment.  You feel like the room is spinning (vertigo). Summary  Earwax can build up in the ear and cause discomfort or hearing loss.  The most common symptoms of this condition include reduced or muffled hearing and a feeling of fullness in the ear or feeling that the ear is plugged.  This condition may be  diagnosed based on your symptoms, your medical history, and an ear exam.  This condition may be treated by using ear drops to soften the earwax or by having the earwax removed by a health care provider.  Do not put any objects, including cotton swabs, into your ear. You can clean the opening of your ear canal with a washcloth or facial tissue. This information is not intended to replace advice given to you by your health care provider. Make sure you discuss any questions you have with your health care provider. Document Released: 09/06/2004 Document Revised: 10/10/2016 Document  Reviewed: 10/10/2016 Elsevier Interactive Patient Education  2018 [employer]  Get These Tests  Blood Pressure- Have your blood pressure checked by your healthcare provider at least once a year.  Normal blood pressure is 120/80.  Weight- Have your body mass index (BMI) calculated to screen for obesity.  BMI is a measure of body fat based on height and weight.  You can calculate your own BMI at [employer]  Cholesterol- Have your cholesterol checked every year.  Diabetes- Have your blood sugar checked every year if you have high blood pressure, high cholesterol, a family history of diabetes or if you are overweight.  Pap Test - Have a pap test every 1 to 5 years if you have been sexually active.  If you are older than 65 and recent pap tests have been normal you may not need additional pap tests.  In addition, if you have had a hysterectomy  for benign disease additional pap tests are not necessary.  Mammogram-Yearly mammograms are essential for early detection of breast cancer  Screening for Colon Cancer- Colonoscopy starting at age 68. Screening may begin sooner depending on your family history and other health conditions.  Follow up colonoscopy as directed by your Gastroenterologist.  Screening for Osteoporosis- Screening begins at age 28 with bone density scanning, sooner if you are at higher risk for developing Osteoporosis.  Get these medicines  Calcium with Vitamin D- Your body requires 1200-1500 mg of Calcium a day and [phone] IU of Vitamin D a day.  You can only absorb 500 mg of Calcium at a time therefore Calcium must be taken in 2 or 3 separate doses throughout the day.  Hormones- Hormone therapy has been associated with increased risk for certain cancers and heart disease.  Talk to your healthcare provider about if you need relief from menopausal symptoms.  Aspirin- Ask your healthcare provider about taking Aspirin to prevent Heart  Disease and Stroke.  Get these Immuniztions  Flu shot- Every fall  Pneumonia shot- Once after the age of 47; if you are younger ask your healthcare provider if you need a pneumonia shot.  Tetanus- Every ten years.  Zostavax- Once after the age of 71 to prevent shingles.  Take these steps  Don't smoke- Your healthcare provider can help you quit. For tips on how to quit, ask your healthcare provider or go to www.smokefree.gov or call 1-800 QUIT-NOW.  Be physically active- Exercise 5 days a week for a minimum of 30 minutes.  If you are not already physically active, start slow and gradually work up to 30 minutes of moderate physical activity.  Try walking, dancing, bike riding, swimming, etc.  Eat a healthy diet- Eat a variety of healthy foods such as fruits, vegetables, whole grains, low fat milk, low fat cheeses, yogurt, lean meats, chicken, fish, eggs, dried beans, tofu, etc.  For more information go to www.thenutritionsource.org  Dental visit- Brush and floss teeth twice daily; visit your dentist twice a year.  Eye exam- Visit your Optometrist or Ophthalmologist yearly.  Drink alcohol in moderation- Limit alcohol intake to one drink or less a day.  Never drink and drive.  Depression- Your emotional health is as important as your physical health.  If you're feeling down or losing interest in things you normally enjoy, please talk to your healthcare provider.  Seat Belts- can save your life; always wear one  Smoke/Carbon Monoxide detectors- These detectors need to be installed on the appropriate level of your home.  Replace batteries at least once a year.  Violence- If anyone is threatening or hurting you, please tell your healthcare provider.  Living Will/ Health care power of attorney- Discuss with your healthcare provider and family.   IF you received an x-ray today, you will receive an invoice from [facility]. Please contact [facility] at [phone] with  questions or concerns regarding your invoice.   IF you received labwork today, you will receive an invoice from [place]. Please contact [facility] at [phone] with questions or concerns regarding your invoice.   Our billing staff will not be able to assist you with questions regarding bills from these companies.  You will be contacted with the lab results as soon as they are available. The fastest way to get your results is to activate your My Chart account. Instructions are located on the last page of this paperwork. If you have not heard from [place] regarding the results in 2 weeks, please contact this office.

## 2017-12-12 NOTE — Progress Notes (Signed)
Subjective:  By signing my name below, I, Essence Howell, attest that this documentation has been prepared under the direction and in the presence of Wendie Agreste, MD Electronically Signed: Ladene Artist, ED Scribe 12/12/2017 at 11:19 AM.   Patient ID: April Banks, female    DOB: 12-26-1955, 62 y.o.   MRN: [id number]  Chief Complaint  Patient presents with  . Medication Refill    acyclovir  . Annual Exam    pt feels that this is not needed    HPI April Banks is a 62 y.o. female who presents to Primary Care at [facility] for an annual exam. Pt is not fasting at this visit; she had a cup of orange juice around 7:30 AM today.  Cold-like Symptoms Pt reports cold-like symptoms including gradually improving cough with sob, fatigue, chills, rhinorrhea, scratchy throat and nasal congestion x 1 wk. Reports fatigue and sob is chronic due to CKD but denies change in these symptoms. Also reports an intermittent clogging sensation to the R ear for the past few months, worse in the mornings. Denies cp, chest tightness.  Herpes Acyclovir daily x 30 yrs. Last outbreak was more than 20 yrs ago.  H/o IGA Neuropathy with CKD Nephrologist: Dr. Joseph Berkshire at [facility]. Last visit in Jan. Stage 4. Treated conservatively with fish oil. On the list for renal transplant. Stable renal function so work up for donors on hold based on note. - Followed every 3 months by Dr. Joseph Berkshire; next appointment is this afternoon.  HTN Continued diltiazem, diovan, hydralazine and lasix. Home BP readings range 120s-130s/68-73.  Hyperlipidemia Lab Results  Component Value Date   CHOL 205 (H) 07/05/2014   HDL 68 10/20/2015   LDLCALC 101 10/20/2015   TRIG 132 10/20/2015   CHOLHDL 3.0 07/05/2014   Lab Results  Component Value Date   ALT 13 07/05/2014   AST 13 07/05/2014   ALKPHOS 86 07/05/2014   BILITOT 0.3 07/05/2014  Crestor 10 mg qd.  Gout Seen by rheumatology at [facility]. Allopurinol 200 mg  qd, colchicine prn. Stable at 2/22 visit with rheum. No change in meds. - States that her last flare up was a few yrs ago.  Situational Stress/Anxiety Discussed in Nov 2018. Met with therapist. Klonopin prn. - Reports some stress/worry surrounding caring for her elderly parents but states she has been doing better overall. Also states that her son who was recently involved in a MVC is doing better, more stable and has returned to school.  CA Screening Colonoscopy: 09/2016 by Dr. Fuller Plan, diverticulosis otherwise normal, rpt 10 yrs Breast CA Screening: 11/26/17, no evidence of malignancy Cervical CA Screening: pap 09/22/16, neg Skin CA Screening: followed by dermatology every 6 months; recently had a lesion removed  Immunizations Immunization History  Administered Date(s) Administered  . Influenza Split 06/08/2011, 06/16/2012, 05/28/2013  . Influenza,inj,Quad PF,6+ Mos 05/28/2013  . Influenza-Unspecified 05/17/2014, 07/10/2016  . PPD Test 03/22/2014  . Pneumococcal Conjugate-13 07/05/2014  . Pneumococcal Polysaccharide-23 09/10/2014  . Tdap 08/13/2006  Shingrix: sent to pharmacy Tetanus: updated today  Hep C and HIV screening: today  Depression Screening Depression screen [facility] 2/9 12/12/2017 06/24/2017 01/15/2017 09/22/2016 08/02/2016  Decreased Interest 0 1 0 0 0  Down, Depressed, Hopeless 0 1 0 0 0  PHQ - 2 Score 0 2 0 0 0  Altered sleeping - 3 - - -  Tired, decreased energy - 3 - - -  Change in appetite - 1 - - -  Feeling bad or failure about yourself  - 0 - - -  Trouble concentrating - 3 - - -  Moving slowly or fidgety/restless - 0 - - -  Suicidal thoughts - 0 - - -  PHQ-9 Score - 12 - - -  Difficult doing work/chores - Very difficult - - -    Visual Acuity Screening   Right eye Left eye Both eyes  Without correction:     With correction: 20/15-2 20/20 20/15   Vision: last exam in April; she will get new glass lenses this afternoon Dentist: regularly; last exam in  April Exercise: none recently but plans to start exercising again  Patient Active Problem List   Diagnosis Date Noted  . Right rotator cuff tendinitis 07/14/2015  . Chronic kidney disease (CKD), stage III (moderate) ([place]) 06/05/2015  . Gouty arthritis 05/25/2015  . Gout 03/18/2015  . MVP (mitral valve prolapse) 08/25/2011  . Herpes genitalis in women 08/25/2011  . Other and unspecified hyperlipidemia 09/04/2010  . OBESITY, UNSPECIFIED 09/04/2010  . ESSENTIAL HYPERTENSION, BENIGN 09/04/2010  . IGA NEPHROPATHY 07/31/2010   Past Medical History:  Diagnosis Date  . Anemia   . Anxiety   . Basal cell carcinoma of leg   . Depression   . Fat necrosis (segmental) of breast 02/15/2012  . Heart murmur   . Hyperlipidemia   . Hypertension   . Nephrolithiasis   . Renal abscess    Past Surgical History:  Procedure Laterality Date  . ABDOMINAL HYSTERECTOMY    . ARTHROPLASTY W/ ARTHROSCOPY MEDIAL / LATERAL COMPARTMENT KNEE    . AUGMENTATION MAMMAPLASTY Bilateral 08/13/1988  . BREAST ENHANCEMENT SURGERY  1990  . BREAST SURGERY    . EYE SURGERY    . RENAL BIOPSY  2010  . TUBAL LIGATION     Allergies  Allergen Reactions  . Gantrisin [Sulfisoxazole] Shortness Of Breath and Nausea Only  . Sulfa Antibiotics Shortness Of Breath and Rash  . Ceclor [Cefaclor] Rash  . Macrodantin Nausea And Vomiting  . Tramadol Nausea And Vomiting  . Levaquin [Levofloxacin In D5w] Other (See Comments)    Arm pain  . Lisinopril Cough  . Pyridium [Phenazopyridine Hcl]   . Pyridium [Phenazopyridine Hcl] Nausea Only   Prior to Admission medications   Medication Sig Start Date End Date Taking? Authorizing Provider  acetaminophen (TYLENOL) 500 MG tablet Take 500 mg by mouth every 6 (six) hours as needed.    [provider]  acyclovir (ZOVIRAX) 200 MG capsule TAKE 1 CAPSULE DAILY 11/11/17   Wendie Agreste, MD  allopurinol (ZYLOPRIM) 100 MG tablet TAKE 2 TABLETS DAILY Patient taking differently: TAKE  2.5 TABLETS DAILY 04/10/16   Wendie Agreste, MD  aspirin 81 MG tablet Take 81 mg by mouth daily.      [provider]  clonazePAM (KLONOPIN) 0.5 MG tablet Take 0.5-1 tablets (0.25-0.5 mg total) 2 (two) times daily as needed by mouth for anxiety. 06/24/17   Wendie Agreste, MD  colchicine 0.6 MG tablet Take 2 tablets (1.2 mg total) by mouth once as needed. May take 1 tablet 1 hour later if needed. 10/19/15   Wendie Agreste, MD  diltiazem (CARTIA XT) 180 MG 24 hr capsule Take 1 capsule (180 mg total) by mouth daily. 03/15/15   Wendie Agreste, MD  fish oil-omega-3 fatty acids 1000 MG capsule Take 2 g by mouth 3 (three) times daily.      [provider]  furosemide (LASIX) 20 MG tablet  Take 3 tablets (60 mg total) by mouth daily. 03/15/15   Wendie Agreste, MD  hydrALAZINE (APRESOLINE) 25 MG tablet Take 25 mg by mouth 3 (three) times daily. Take 1.5 tablets BID    [provider]  losartan (COZAAR) 50 MG tablet Take 50 mg by mouth. 04/03/17 04/03/18  [provider]  potassium citrate (UROCIT-K) 10 MEQ (1080 MG) SR tablet TAKE 1 TABLET DAILY BEFORE BREAKFAST Patient not taking: Reported on 06/24/2017 02/28/15   Wendie Agreste, MD  rosuvastatin (CRESTOR) 10 MG tablet Take 1 tablet (10 mg total) by mouth daily. 10/19/15   Wendie Agreste, MD  valsartan (DIOVAN) 80 MG tablet TAKE 1 TABLET DAILY Patient not taking: Reported on 06/24/2017 09/13/14   Wendie Agreste, MD   Social History   Socioeconomic History  . Marital status: Divorced    Spouse name: Not on file  . Number of children: Not on file  . Years of education: Not on file  . Highest education level: Not on file  Occupational History  . Not on file  Social Needs  . Financial resource strain: Not on file  . Food insecurity:    Worry: Not on file    Inability: Not on file  . Transportation needs:    Medical: Not on file    Non-medical: Not on file  Tobacco Use  . Smoking status: Never Smoker   . Smokeless tobacco: Never Used  Substance and Sexual Activity  . Alcohol use: Yes    Alcohol/week: 1.2 oz    Types: 2 Standard drinks or equivalent per week    Comment: rare occasion  . Drug use: No  . Sexual activity: Not on file  Lifestyle  . Physical activity:    Days per week: Not on file    Minutes per session: Not on file  . Stress: Not on file  Relationships  . Social connections:    Talks on phone: Not on file    Gets together: Not on file    Attends religious service: Not on file    Active member of club or organization: Not on file    Attends meetings of clubs or organizations: Not on file    Relationship status: Not on file  . Intimate partner violence:    Fear of current or ex partner: Not on file    Emotionally abused: Not on file    Physically abused: Not on file    Forced sexual activity: Not on file  Other Topics Concern  . Not on file  Social History Narrative   Divorced   Engineer, structural:  [employer]   Exercises   Review of Systems  Constitutional: Positive for chills and fatigue (chronic due to CKD).  HENT: Positive for congestion and rhinorrhea.   Respiratory: Positive for cough and shortness of breath (chronic due to CKD). Negative for chest tightness.   Cardiovascular: Negative for chest pain.  Psychiatric/Behavioral: The patient is nervous/anxious (improved).       Objective:   Physical Exam  Constitutional: She is oriented to person, place, and time. She appears well-developed and well-nourished.  HENT:  Head: Normocephalic and atraumatic.  Right Ear: External ear normal.  Left Ear: External ear normal.  Mouth/Throat: Oropharynx is clear and moist.  E ear: Near complete obstruction of R canal with dark yellow cerumen  Eyes: Pupils are equal, round, and reactive to light. Conjunctivae are normal.  Neck: Normal range of motion. Neck supple. No thyromegaly  present.  No neck radiation  Cardiovascular: Normal rate, regular rhythm and  intact distal pulses.  Murmur heard.  Systolic murmur is present with a grade of 2/6. Pulmonary/Chest: Effort normal and breath sounds normal. No respiratory distress. She has no wheezes.  Abdominal: Soft. Bowel sounds are normal. There is no tenderness.  Musculoskeletal: Normal range of motion. She exhibits edema (trace non-pitting). She exhibits no tenderness.  Lymphadenopathy:    She has no cervical adenopathy.  Neurological: She is alert and oriented to person, place, and time.  Skin: Skin is warm and dry. No rash noted.  Psychiatric: She has a normal mood and affect. Her behavior is normal. Thought content normal.   Vitals:   12/12/17 1049  Pulse: 85  Temp: 98.2 F (36.8 C)  TempSrc: Oral  SpO2: 97%  Weight: 207 lb 3.2 oz (94 kg)  Height: 5' 4"  (1.626 m)  Body mass index is 35.57 kg/m.    Assessment & Plan:    April Banks is a 62 y.o. female Annual physical exam  - -anticipatory guidance as below in AVS, screening labs above. Health maintenance items as above in HPI discussed/recommended as applicable.   Need for Tdap vaccination - Plan: Tdap vaccine greater than or equal to 7yo IM given  Need for shingles vaccine - Plan: DISCONTINUED: Zoster Vaccine Adjuvanted [facility]) injection -options discussed, decided on Shingrix, sent to pharmacy  Chronic kidney disease (CKD), stage IV (severe) ([place]) - Plan: Comprehensive metabolic panel  -Check labs, continue plan follow-up with nephrologist with upcoming appointment  Essential hypertension - Plan: Comprehensive metabolic panel  -Overall stable on current regimen, again followed by nephrology.  No med changes.  Hyperlipidemia, unspecified hyperlipidemia type - Plan: Comprehensive metabolic panel, Lipid panel Hyperlipidemia - Plan: rosuvastatin (CRESTOR) 10 MG tablet  -tolerating Crestor, check labs,  Continue same dose for now   History of herpes genitalis - Plan: acyclovir (ZOVIRAX) 200 MG capsule  -Asymptomatic,  continue acyclovir daily for prevention, with increase to 5 times per day during flare.  Impacted cerumen of right ear - Plan: Ear wax removal  -Cerumen disimpacted after instillation of Colace, then lavage.  Tolerated procedure without complication.  Meds ordered this encounter  Medications  . rosuvastatin (CRESTOR) 10 MG tablet    Sig: Take 1 tablet (10 mg total) by mouth daily.    Dispense:  90 tablet    Refill:  1  . acyclovir (ZOVIRAX) 200 MG capsule    Sig: Take 1 capsule (200 mg total) by mouth daily.    Dispense:  90 capsule    Refill:  3  . DISCONTD: Zoster Vaccine Adjuvanted [facility]) injection    Sig: Inject 0.5 mLs into the muscle once for 1 dose. Repeat in 2-6 months.    Dispense:  0.5 mL    Refill:  1  . Zoster Vaccine Adjuvanted [facility]) injection    Sig: Inject 0.5 mLs into the muscle once for 1 dose. Repeat in 2-6 months.    Dispense:  0.5 mL    Refill:  1   Patient Instructions   I sent shingles vaccine to your pharmacy.   Tetanus updated today with pertussis.   Screening lab work today.   See info on cerumen impaction below.   Thank you for coming in today.    Earwax Buildup, Adult The ears produce a substance called earwax that helps keep bacteria out of the ear and protects the skin in the ear canal. Occasionally, earwax can build up in  the ear and cause discomfort or hearing loss. What increases the risk? This condition is more likely to develop in people who:  Are female.  Are elderly.  Naturally produce more earwax.  Clean their ears often with cotton swabs.  Use earplugs often.  Use in-ear headphones often.  Wear hearing aids.  Have narrow ear canals.  Have earwax that is overly thick or sticky.  Have eczema.  Are dehydrated.  Have excess hair in the ear canal.  What are the signs or symptoms? Symptoms of this condition include:  Reduced or muffled hearing.  A feeling of fullness in the ear or feeling that the ear is  plugged.  Fluid coming from the ear.  Ear pain.  Ear itch.  Ringing in the ear.  Coughing.  An obvious piece of earwax that can be seen inside the ear canal.  How is this diagnosed? This condition may be diagnosed based on:  Your symptoms.  Your medical history.  An ear exam. During the exam, your health care provider will look into your ear with an instrument called an otoscope.  You may have tests, including a hearing test. How is this treated? This condition may be treated by:  Using ear drops to soften the earwax.  Having the earwax removed by a health care provider. The health care provider may: ? Flush the ear with water. ? Use an instrument that has a loop on the end (curette). ? Use a suction device.  Surgery to remove the wax buildup. This may be done in severe cases.  Follow these instructions at home:  Take over-the-counter and prescription medicines only as told by your health care provider.  Do not put any objects, including cotton swabs, into your ear. You can clean the opening of your ear canal with a washcloth or facial tissue.  Follow instructions from your health care provider about cleaning your ears. Do not over-clean your ears.  Drink enough fluid to keep your urine clear or pale yellow. This will help to thin the earwax.  Keep all follow-up visits as told by your health care provider. If earwax builds up in your ears often or if you use hearing aids, consider seeing your health care provider for routine, preventive ear cleanings. Ask your health care provider how often you should schedule your cleanings.  If you have hearing aids, clean them according to instructions from the manufacturer and your health care provider. Contact a health care provider if:  You have ear pain.  You develop a fever.  You have blood, pus, or other fluid coming from your ear.  You have hearing loss.  You have ringing in your ears that does not go away.  Your  symptoms do not improve with treatment.  You feel like the room is spinning (vertigo). Summary  Earwax can build up in the ear and cause discomfort or hearing loss.  The most common symptoms of this condition include reduced or muffled hearing and a feeling of fullness in the ear or feeling that the ear is plugged.  This condition may be diagnosed based on your symptoms, your medical history, and an ear exam.  This condition may be treated by using ear drops to soften the earwax or by having the earwax removed by a health care provider.  Do not put any objects, including cotton swabs, into your ear. You can clean the opening of your ear canal with a washcloth or facial tissue. This information is not intended to  replace advice given to you by your health care provider. Make sure you discuss any questions you have with your health care provider. Document Released: 09/06/2004 Document Revised: 10/10/2016 Document Reviewed: 10/10/2016 Elsevier Interactive Patient Education  2018 [place] [employer]  Get These Tests  Blood Pressure- Have your blood pressure checked by your healthcare provider at least once a year.  Normal blood pressure is 120/80.  Weight- Have your body mass index (BMI) calculated to screen for obesity.  BMI is a measure of body fat based on height and weight.  You can calculate your own BMI at [employer]  Cholesterol- Have your cholesterol checked every year.  Diabetes- Have your blood sugar checked every year if you have high blood pressure, high cholesterol, a family history of diabetes or if you are overweight.  Pap Test - Have a pap test every 1 to 5 years if you have been sexually active.  If you are older than 65 and recent pap tests have been normal you may not need additional pap tests.  In addition, if you have had a hysterectomy  for benign disease additional pap tests are not necessary.  Mammogram-Yearly mammograms are  essential for early detection of breast cancer  Screening for Colon Cancer- Colonoscopy starting at age 84. Screening may begin sooner depending on your family history and other health conditions.  Follow up colonoscopy as directed by your Gastroenterologist.  Screening for Osteoporosis- Screening begins at age 42 with bone density scanning, sooner if you are at higher risk for developing Osteoporosis.  Get these medicines  Calcium with Vitamin D- Your body requires 1200-1500 mg of Calcium a day and [phone] IU of Vitamin D a day.  You can only absorb 500 mg of Calcium at a time therefore Calcium must be taken in 2 or 3 separate doses throughout the day.  Hormones- Hormone therapy has been associated with increased risk for certain cancers and heart disease.  Talk to your healthcare provider about if you need relief from menopausal symptoms.  Aspirin- Ask your healthcare provider about taking Aspirin to prevent Heart Disease and Stroke.  Get these Immuniztions  Flu shot- Every fall  Pneumonia shot- Once after the age of 19; if you are younger ask your healthcare provider if you need a pneumonia shot.  Tetanus- Every ten years.  Zostavax- Once after the age of 41 to prevent shingles.  Take these steps  Don't smoke- Your healthcare provider can help you quit. For tips on how to quit, ask your healthcare provider or go to www.smokefree.gov or call 1-800 QUIT-NOW.  Be physically active- Exercise 5 days a week for a minimum of 30 minutes.  If you are not already physically active, start slow and gradually work up to 30 minutes of moderate physical activity.  Try walking, dancing, bike riding, swimming, etc.  Eat a healthy diet- Eat a variety of healthy foods such as fruits, vegetables, whole grains, low fat milk, low fat cheeses, yogurt, lean meats, chicken, fish, eggs, dried beans, tofu, etc.  For more information go to www.thenutritionsource.org  Dental visit- Brush and floss teeth twice  daily; visit your dentist twice a year.  Eye exam- Visit your Optometrist or Ophthalmologist yearly.  Drink alcohol in moderation- Limit alcohol intake to one drink or less a day.  Never drink and drive.  Depression- Your emotional health is as important as your physical health.  If you're feeling down or losing interest in things you normally enjoy, please  talk to your healthcare provider.  Seat Belts- can save your life; always wear one  Smoke/Carbon Monoxide detectors- These detectors need to be installed on the appropriate level of your home.  Replace batteries at least once a year.  Violence- If anyone is threatening or hurting you, please tell your healthcare provider.  Living Will/ Health care power of attorney- Discuss with your healthcare provider and family.   IF you received an x-ray today, you will receive an invoice from [facility]. Please contact [facility] at [phone] with questions or concerns regarding your invoice.   IF you received labwork today, you will receive an invoice from [place]. Please contact [facility] at [phone] with questions or concerns regarding your invoice.   Our billing staff will not be able to assist you with questions regarding bills from these companies.  You will be contacted with the lab results as soon as they are available. The fastest way to get your results is to activate your My Chart account. Instructions are located on the last page of this paperwork. If you have not heard from [place] regarding the results in 2 weeks, please contact this office.       I personally performed the services described in this documentation, which was scribed in my presence. The recorded information has been reviewed and considered for accuracy and completeness, addended by me as needed, and agree with information above.  Signed,   [name], MD [facility] at [place].  12/13/17 1:11 PM

## 2017-12-13 LAB — LIPID PANEL
Chol/HDL Ratio: 2.5 ratio (ref 0.0–4.4)
Cholesterol, Total: 174 mg/dL (ref 100–199)
HDL: 69 mg/dL (ref >39)
LDL Calculated: 82 mg/dL (ref 0–99)
Triglycerides: 116 mg/dL (ref 0–149)
VLDL Cholesterol Cal: 23 mg/dL (ref 5–40)

## 2017-12-13 LAB — COMPREHENSIVE METABOLIC PANEL
A/G RATIO: 2.1 (ref 1.2–2.2)
ALT: 13 IU/L (ref 0–32)
AST: 12 IU/L (ref 0–40)
Albumin: 4.2 g/dL (ref 3.6–4.8)
Alkaline Phosphatase: 101 IU/L (ref 39–117)
BUN/Creatinine Ratio: 22 (ref 12–28)
BUN: 53 mg/dL — ABNORMAL HIGH (ref 8–27)
Bilirubin Total: <0.2 mg/dL (ref 0.0–1.2)
CO2: 22 mmol/L (ref 20–29)
Calcium: 9.5 mg/dL (ref 8.7–10.3)
Chloride: 106 mmol/L (ref 96–106)
Creatinine, Ser: 2.42 mg/dL — ABNORMAL HIGH (ref 0.57–1.00)
GFR, EST AFRICAN AMERICAN: 24 mL/min/{1.73_m2} — AB (ref >59)
GFR, EST NON AFRICAN AMERICAN: 21 mL/min/{1.73_m2} — AB (ref >59)
GLOBULIN, TOTAL: 2 g/dL (ref 1.5–4.5)
Glucose: 96 mg/dL (ref 65–99)
POTASSIUM: 4.5 mmol/L (ref 3.5–5.2)
SODIUM: 145 mmol/L — AB (ref 134–144)
TOTAL PROTEIN: 6.2 g/dL (ref 6.0–8.5)

## 2018-05-19 DIAGNOSIS — D631 Anemia in chronic kidney disease: Secondary | ICD-10-CM | POA: Diagnosis not present

## 2018-05-19 DIAGNOSIS — E611 Iron deficiency: Secondary | ICD-10-CM | POA: Diagnosis not present

## 2018-05-19 DIAGNOSIS — N184 Chronic kidney disease, stage 4 (severe): Secondary | ICD-10-CM | POA: Diagnosis not present

## 2018-05-19 DIAGNOSIS — I129 Hypertensive chronic kidney disease with stage 1 through stage 4 chronic kidney disease, or unspecified chronic kidney disease: Secondary | ICD-10-CM | POA: Diagnosis not present

## 2018-08-07 ENCOUNTER — Encounter: Payer: Self-pay | Admitting: Family Medicine

## 2018-08-07 DIAGNOSIS — Z8619 Personal history of other infectious and parasitic diseases: Secondary | ICD-10-CM

## 2018-08-07 NOTE — Telephone Encounter (Signed)
Please advise on message below. Dose pt need appointment for additional refills of ZOVIRAX?   Thanks, [employer]

## 2018-08-10 MED ORDER — ACYCLOVIR 200 MG PO CAPS: 200.0000 mg | ORAL_CAPSULE | Freq: Every day | ORAL | 3 refills | Status: DC

## 2018-09-15 DIAGNOSIS — L820 Inflamed seborrheic keratosis: Secondary | ICD-10-CM | POA: Diagnosis not present

## 2018-09-15 DIAGNOSIS — D225 Melanocytic nevi of trunk: Secondary | ICD-10-CM | POA: Diagnosis not present

## 2018-09-15 DIAGNOSIS — D631 Anemia in chronic kidney disease: Secondary | ICD-10-CM | POA: Diagnosis not present

## 2018-09-15 DIAGNOSIS — D229 Melanocytic nevi, unspecified: Secondary | ICD-10-CM | POA: Diagnosis not present

## 2018-09-15 DIAGNOSIS — D485 Neoplasm of uncertain behavior of skin: Secondary | ICD-10-CM | POA: Diagnosis not present

## 2018-09-15 DIAGNOSIS — Z85828 Personal history of other malignant neoplasm of skin: Secondary | ICD-10-CM | POA: Diagnosis not present

## 2018-09-15 DIAGNOSIS — N184 Chronic kidney disease, stage 4 (severe): Secondary | ICD-10-CM | POA: Diagnosis not present

## 2018-09-15 DIAGNOSIS — I129 Hypertensive chronic kidney disease with stage 1 through stage 4 chronic kidney disease, or unspecified chronic kidney disease: Secondary | ICD-10-CM | POA: Diagnosis not present

## 2018-10-10 DIAGNOSIS — N028 Recurrent and persistent hematuria with other morphologic changes: Secondary | ICD-10-CM | POA: Diagnosis not present

## 2018-10-10 DIAGNOSIS — N184 Chronic kidney disease, stage 4 (severe): Secondary | ICD-10-CM | POA: Diagnosis not present

## 2018-10-10 DIAGNOSIS — M109 Gout, unspecified: Secondary | ICD-10-CM | POA: Diagnosis not present

## 2018-10-15 DIAGNOSIS — I120 Hypertensive chronic kidney disease with stage 5 chronic kidney disease or end stage renal disease: Secondary | ICD-10-CM | POA: Diagnosis not present

## 2018-10-15 DIAGNOSIS — N028 Recurrent and persistent hematuria with other morphologic changes: Secondary | ICD-10-CM | POA: Diagnosis not present

## 2018-10-15 DIAGNOSIS — Z01818 Encounter for other preprocedural examination: Secondary | ICD-10-CM | POA: Diagnosis not present

## 2018-10-15 DIAGNOSIS — N186 End stage renal disease: Secondary | ICD-10-CM | POA: Diagnosis not present

## 2018-10-15 DIAGNOSIS — I129 Hypertensive chronic kidney disease with stage 1 through stage 4 chronic kidney disease, or unspecified chronic kidney disease: Secondary | ICD-10-CM | POA: Diagnosis not present

## 2018-10-15 DIAGNOSIS — N184 Chronic kidney disease, stage 4 (severe): Secondary | ICD-10-CM | POA: Diagnosis not present

## 2018-10-15 DIAGNOSIS — N185 Chronic kidney disease, stage 5: Secondary | ICD-10-CM | POA: Diagnosis not present

## 2018-10-21 DIAGNOSIS — Z719 Counseling, unspecified: Secondary | ICD-10-CM | POA: Diagnosis not present

## 2018-10-22 ENCOUNTER — Other Ambulatory Visit: Payer: Self-pay | Admitting: Family Medicine

## 2018-10-22 DIAGNOSIS — Z1231 Encounter for screening mammogram for malignant neoplasm of breast: Secondary | ICD-10-CM

## 2019-01-13 ENCOUNTER — Ambulatory Visit
Admission: RE | Admit: 2019-01-13 | Discharge: 2019-01-13 | Disposition: A | Discharge status: Home / Self Care | Payer: 59 | Source: Ambulatory Visit | Attending: Family Medicine | Admitting: Family Medicine

## 2019-01-13 ENCOUNTER — Ambulatory Visit: Payer: 59

## 2019-01-13 ENCOUNTER — Other Ambulatory Visit: Payer: Self-pay

## 2019-01-13 DIAGNOSIS — Z1231 Encounter for screening mammogram for malignant neoplasm of breast: Secondary | ICD-10-CM

## 2019-01-15 ENCOUNTER — Other Ambulatory Visit: Payer: Self-pay | Admitting: Family Medicine

## 2019-01-15 DIAGNOSIS — R928 Other abnormal and inconclusive findings on diagnostic imaging of breast: Secondary | ICD-10-CM

## 2019-01-21 ENCOUNTER — Other Ambulatory Visit: Payer: Self-pay

## 2019-01-21 ENCOUNTER — Other Ambulatory Visit: Payer: Self-pay | Admitting: Family Medicine

## 2019-01-21 ENCOUNTER — Encounter: Payer: Self-pay | Admitting: Family Medicine

## 2019-01-21 ENCOUNTER — Ambulatory Visit
Admission: RE | Admit: 2019-01-21 | Discharge: 2019-01-21 | Disposition: A | Discharge status: Home / Self Care | Payer: 59 | Source: Ambulatory Visit | Attending: Family Medicine | Admitting: Family Medicine

## 2019-01-21 DIAGNOSIS — R928 Other abnormal and inconclusive findings on diagnostic imaging of breast: Secondary | ICD-10-CM

## 2019-02-12 ENCOUNTER — Ambulatory Visit: Payer: Self-pay

## 2019-02-12 NOTE — Telephone Encounter (Signed)
Patient says she's planning on going to the [place] at the end of the month on July 31st and they are requiring to have in hand a copy of the Covid Test. She asks how long will the results take to come back, I advised 3-5 days is the turn around time and maybe a few days longer. I advised I will send this to the office and Dr. Carlota Raspberry will have a request sent for testing and a nurse will call her back to schedule, she verbalized understanding.

## 2019-02-12 NOTE — Telephone Encounter (Deleted)
  Reason for Disposition . [1] Caller requesting NON-URGENT health information AND [2] PCP's office is the best resource  Protocols used: INFORMATION ONLY CALL-A-AH

## 2019-02-16 ENCOUNTER — Encounter: Payer: Self-pay | Admitting: *Deleted

## 2019-02-16 NOTE — Telephone Encounter (Signed)
My Chart message sent with [place] options for COVID  Testing sites.

## 2019-05-12 ENCOUNTER — Encounter: Payer: Self-pay | Admitting: Family Medicine

## 2019-07-27 ENCOUNTER — Other Ambulatory Visit: Payer: 59

## 2019-08-05 ENCOUNTER — Other Ambulatory Visit: Payer: Self-pay

## 2019-08-05 ENCOUNTER — Other Ambulatory Visit: Payer: Self-pay | Admitting: Family Medicine

## 2019-08-05 ENCOUNTER — Ambulatory Visit
Admission: RE | Admit: 2019-08-05 | Discharge: 2019-08-05 | Disposition: A | Discharge status: Home / Self Care | Payer: 59 | Source: Ambulatory Visit | Attending: Family Medicine | Admitting: Family Medicine

## 2019-08-05 DIAGNOSIS — R928 Other abnormal and inconclusive findings on diagnostic imaging of breast: Secondary | ICD-10-CM

## 2019-09-20 ENCOUNTER — Other Ambulatory Visit: Payer: Self-pay | Admitting: Family Medicine

## 2019-09-20 ENCOUNTER — Encounter: Payer: Self-pay | Admitting: Family Medicine

## 2019-09-20 DIAGNOSIS — Z8619 Personal history of other infectious and parasitic diseases: Secondary | ICD-10-CM

## 2019-09-20 NOTE — Telephone Encounter (Signed)
Requested Prescriptions  Pending Prescriptions Disp Refills  . acyclovir (ZOVIRAX) 200 MG capsule [Pharmacy Med Name: ACYCLOVIR  $RemoveBe'200MG'[id number]$   CAP] 90 capsule 0    Sig: TAKE 1 CAPSULE BY MOUTH  DAILY     Antimicrobials:  Antiviral Agents - Anti-Herpetic Failed - 09/20/2019  3:13 PM      Failed - Valid encounter within last 12 months    Recent Outpatient Visits          1 year ago Annual physical exam   [facility] at [place] [facility], Ranell Patrick, MD   2 years ago Chest pain, unspecified type   [facility] at [facility], Ranell Patrick, MD   2 years ago Flank pain   [facility] at [facility], Arlie Solomons, MD   2 years ago Gross hematuria   [facility] at [place], [place], [place]   2 years ago Encounter for screening for cervical cancer    [facility], Arlie Solomons, MD

## 2019-09-22 NOTE — Telephone Encounter (Signed)
Rx has been sent in for 90 day supply. No further refills without office visit

## 2020-02-19 ENCOUNTER — Other Ambulatory Visit: Payer: Self-pay

## 2020-02-19 ENCOUNTER — Ambulatory Visit
Admission: RE | Admit: 2020-02-19 | Discharge: 2020-02-19 | Disposition: A | Discharge status: Home / Self Care | Payer: 59 | Source: Ambulatory Visit | Attending: Family Medicine | Admitting: Family Medicine

## 2020-02-19 ENCOUNTER — Other Ambulatory Visit: Payer: Self-pay | Admitting: Family Medicine

## 2020-02-19 DIAGNOSIS — R928 Other abnormal and inconclusive findings on diagnostic imaging of breast: Secondary | ICD-10-CM

## 2020-03-04 IMAGING — US ULTRASOUND LEFT BREAST LIMITED
1 series · 7 of 7 positions shown · non-contrast
Comparison: Previous exam(s).

CLINICAL DATA: Patient recalled from screening for left breast
mass.

EXAM:
DIGITAL DIAGNOSTIC LEFT MAMMOGRAM WITH CAD AND TOMO
ULTRASOUND LEFT BREAST

[Series 1: ultrasound left breast limited · 0.06mm/px · 7 of 7 slices shown]
[im 1/7]
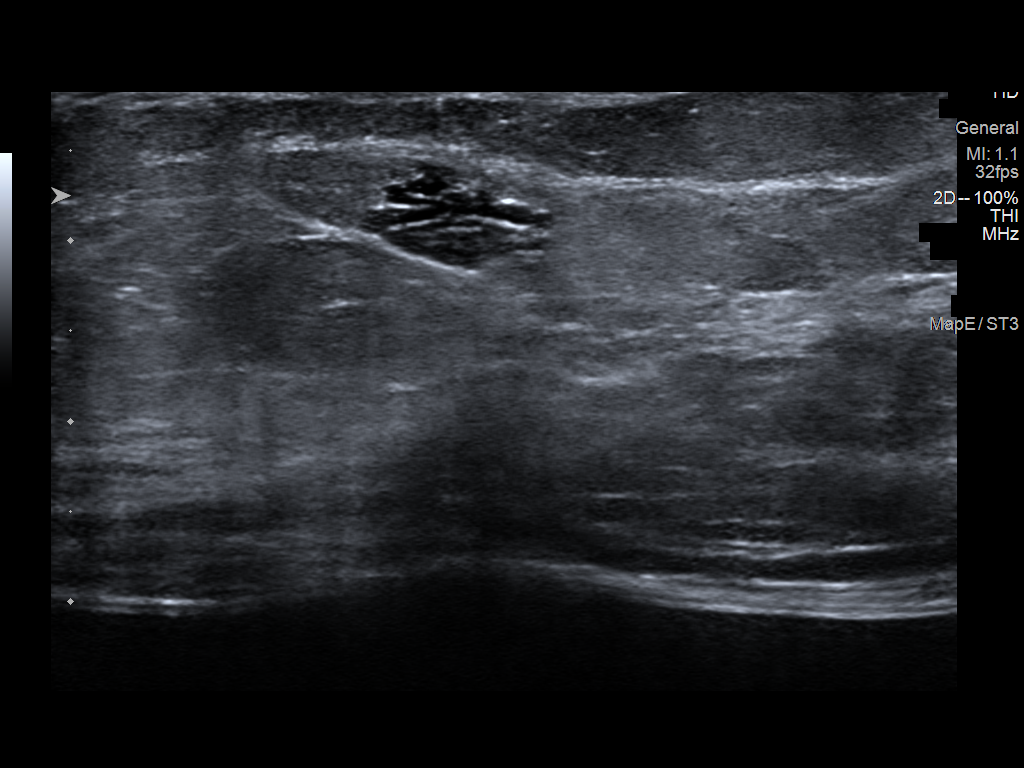
[im 2/7]
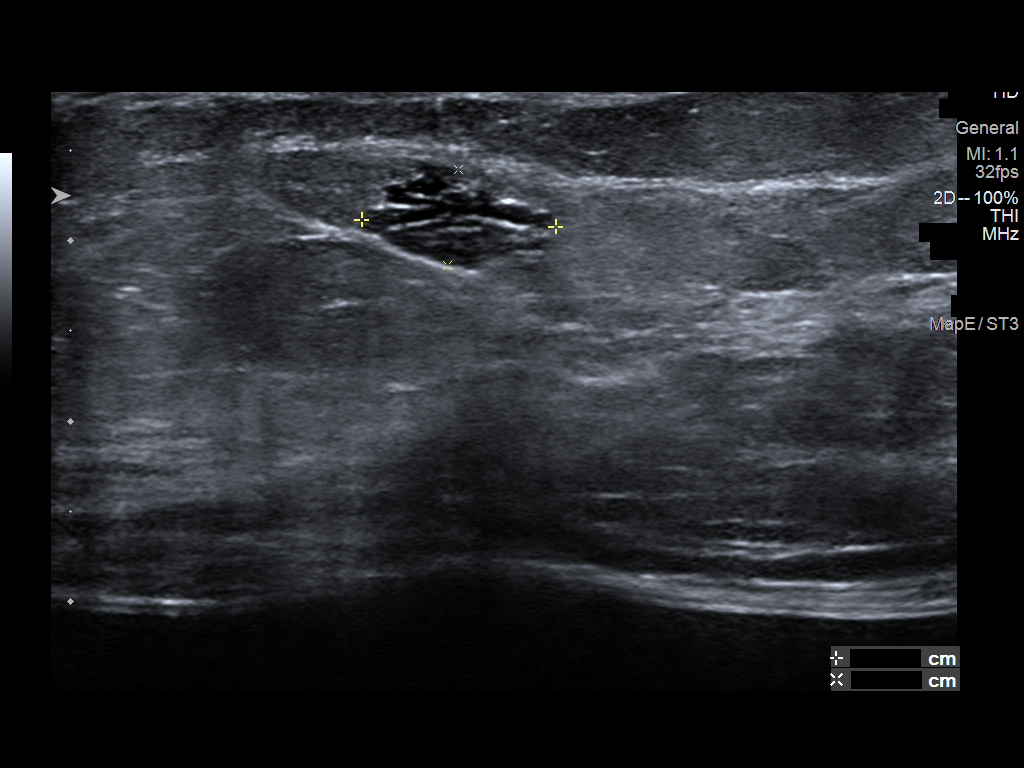
[im 3/7]
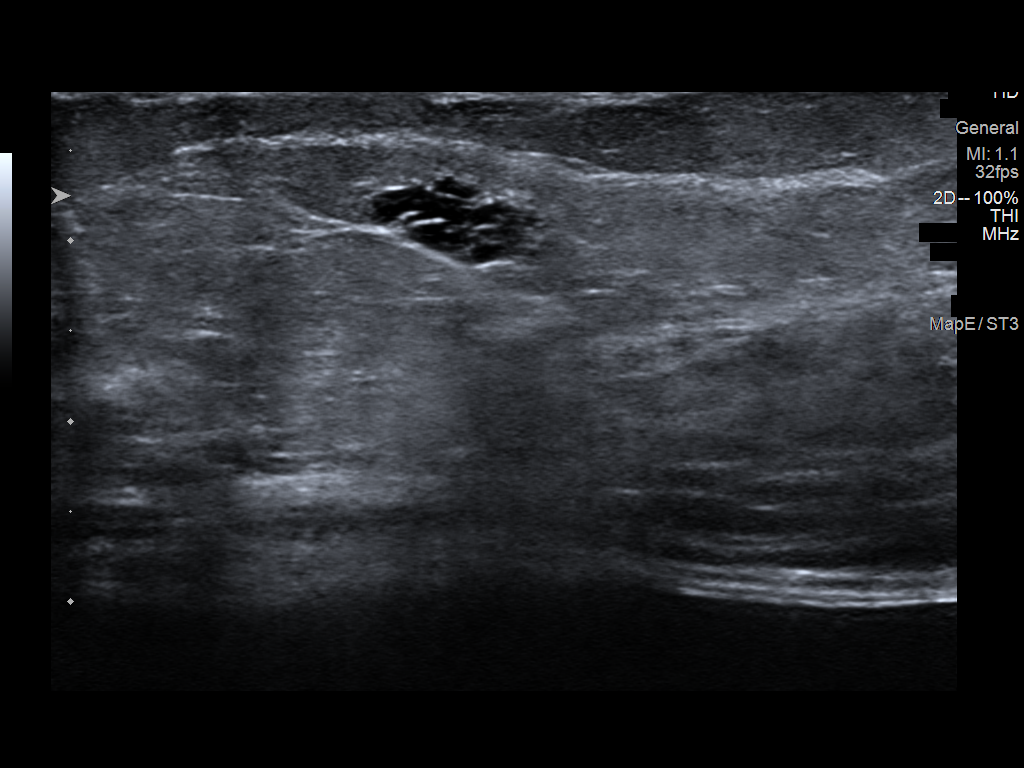
[im 4/7]
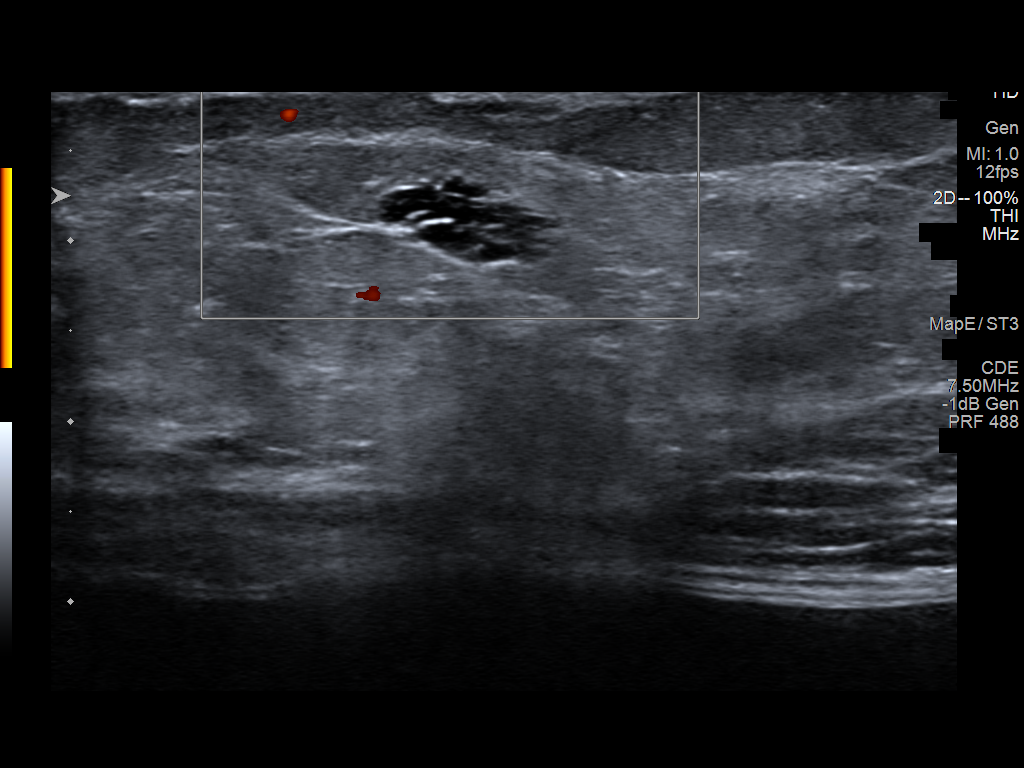
[im 5/7]
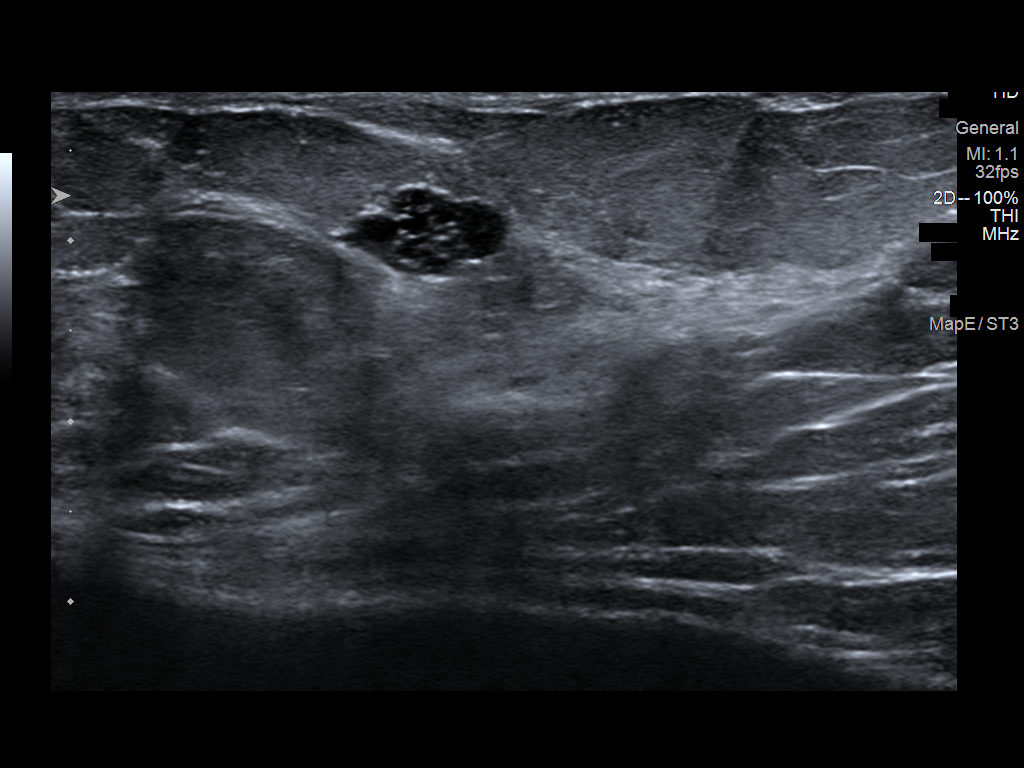
[im 6/7]
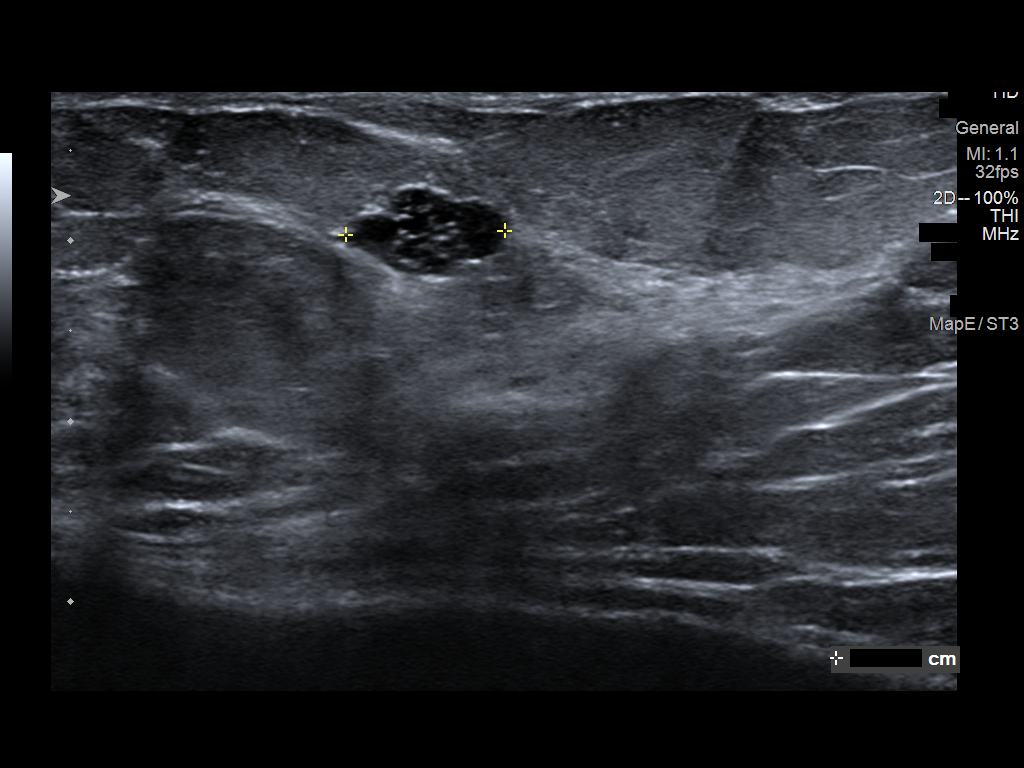
[im 7/7]
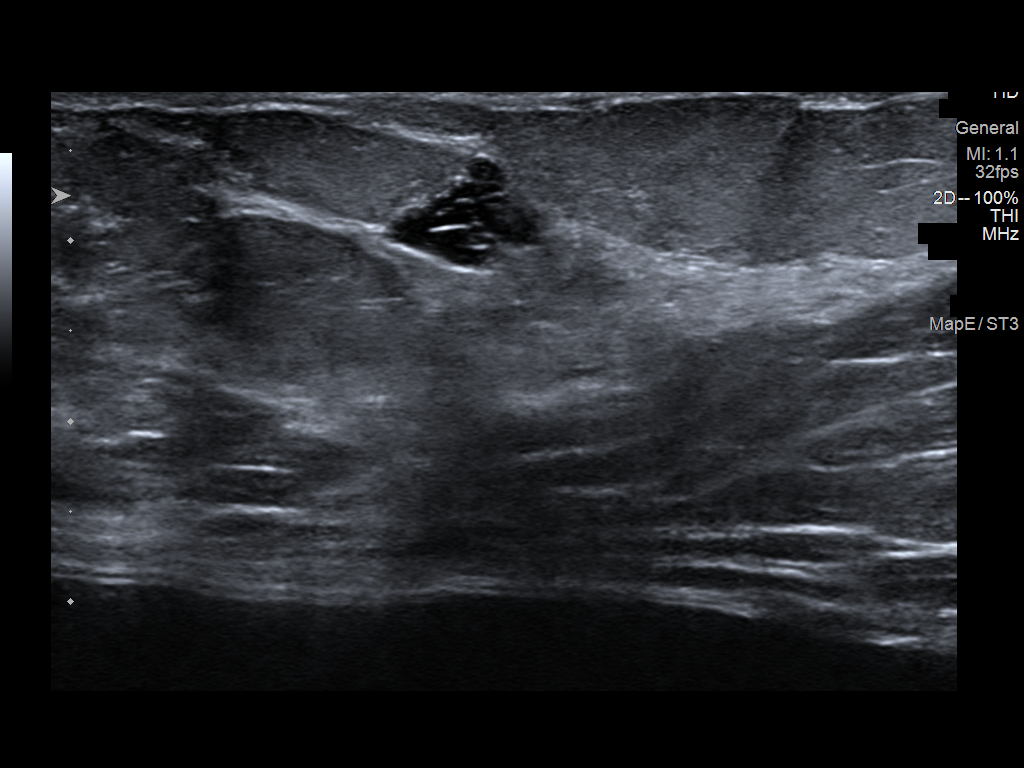

[7 of 7 positions shown; findings below may reference images not displayed]

ACR Breast Density Category b: There are scattered areas of
fibroglandular density.
FINDINGS: There is a persistent lobular mass within the lower inner left
breast anterior depth, further evaluated with spot compression
views.

Mammographic images were processed with CAD.

On physical exam, I palpate no discrete within the lower inner left
breast.

Targeted ultrasound is performed, showing a 1.1 x 0.5 x 0.9 cm
cluster of cysts left breast 7 o'clock position 1 cm from the
nipple.
IMPRESSION: Probably benign left breast mass favored to represent a cluster of
cysts.

RECOMMENDATION:
Left breast ultrasound in 6 months to ensure stability of probably
benign left breast mass.

I have discussed the findings and recommendations with the patient.
Results were also provided in writing at the conclusion of the
visit. If applicable, a reminder letter will be sent to the patient
regarding the next appointment.

BI-RADS CATEGORY  3: Probably benign.
